# Patient Record
Sex: Male | Born: 1970 | Race: Black or African American | Hispanic: No | Marital: Single | Smoking: Former smoker
Health system: Southern US, Community
[De-identification: ages and names within clinical notes are randomized; demographics above are authoritative.]

## PROBLEM LIST (undated history)

## (undated) DIAGNOSIS — A539 Syphilis, unspecified: Secondary | ICD-10-CM

---

## 2001-04-24 ENCOUNTER — Emergency Department (HOSPITAL_COMMUNITY): Admission: EM | Admit: 2001-04-24 | Discharge: 2001-04-24 | Payer: Self-pay | Admitting: Emergency Medicine

## 2003-01-04 ENCOUNTER — Emergency Department (HOSPITAL_COMMUNITY): Admission: EM | Admit: 2003-01-04 | Discharge: 2003-01-04 | Payer: Self-pay | Admitting: Emergency Medicine

## 2003-09-09 ENCOUNTER — Emergency Department (HOSPITAL_COMMUNITY): Admission: EM | Admit: 2003-09-09 | Discharge: 2003-09-09 | Payer: Self-pay | Admitting: Family Medicine

## 2004-01-20 ENCOUNTER — Emergency Department (HOSPITAL_COMMUNITY): Admission: EM | Admit: 2004-01-20 | Discharge: 2004-01-20 | Payer: Self-pay | Admitting: Emergency Medicine

## 2004-03-12 ENCOUNTER — Emergency Department (HOSPITAL_COMMUNITY): Admission: EM | Admit: 2004-03-12 | Discharge: 2004-03-12 | Payer: Self-pay | Admitting: Family Medicine

## 2004-03-12 ENCOUNTER — Ambulatory Visit (HOSPITAL_COMMUNITY): Admission: RE | Admit: 2004-03-12 | Discharge: 2004-03-12 | Payer: Self-pay | Admitting: Family Medicine

## 2005-09-06 ENCOUNTER — Emergency Department (HOSPITAL_COMMUNITY): Admission: EM | Admit: 2005-09-06 | Discharge: 2005-09-06 | Payer: Self-pay | Admitting: Family Medicine

## 2005-11-12 ENCOUNTER — Emergency Department (HOSPITAL_COMMUNITY): Admission: EM | Admit: 2005-11-12 | Discharge: 2005-11-12 | Payer: Self-pay | Admitting: Emergency Medicine

## 2006-04-10 ENCOUNTER — Emergency Department (HOSPITAL_COMMUNITY): Admission: EM | Admit: 2006-04-10 | Discharge: 2006-04-10 | Payer: Self-pay | Admitting: Emergency Medicine

## 2009-01-01 ENCOUNTER — Emergency Department (HOSPITAL_COMMUNITY): Admission: EM | Admit: 2009-01-01 | Discharge: 2009-01-01 | Payer: Self-pay | Admitting: Family Medicine

## 2010-02-15 ENCOUNTER — Encounter: Payer: Self-pay | Admitting: Family Medicine

## 2010-04-28 LAB — GC/CHLAMYDIA PROBE AMP, GENITAL
Chlamydia, DNA Probe: NEGATIVE
GC Probe Amp, Genital: POSITIVE — AB

## 2018-07-31 ENCOUNTER — Ambulatory Visit (HOSPITAL_COMMUNITY)
Admission: EM | Admit: 2018-07-31 | Discharge: 2018-07-31 | Disposition: A | Discharge status: Home / Self Care | Payer: Self-pay | Attending: Family Medicine | Admitting: Family Medicine

## 2018-07-31 ENCOUNTER — Encounter (HOSPITAL_COMMUNITY): Payer: Self-pay

## 2018-07-31 DIAGNOSIS — B070 Plantar wart: Secondary | ICD-10-CM

## 2018-07-31 NOTE — ED Provider Notes (Signed)
[facility]    CSN: [id number] Arrival date & time: 07/31/18  1605      History   Chief Complaint Chief Complaint  Patient presents with  . Foot Pain    Right foot    HPI Berton Lanathaniel B Rodriques is a 48 y.o. male no significant past medical history presenting today for evaluation of toe pain.  Patient states that over the past 3 months he has had pain to his right second toe.  Notes that his great toe and second toe have been overlapped for most of his life and he is developed some callusing areas to both of these toes.  He has tried applying Neosporin which is helped the area on his great toe, but continues to have pain to the bottom of his right second toe.  Denies any drainage.  Denies fevers.  Pain only with weightbearing.  Pain is gradually worsened over the past 3 months.  HPI  History reviewed. No pertinent past medical history.  There are no active problems to display for this patient.   History reviewed. No pertinent surgical history.     Home Medications    Prior to Admission medications   Not on File    Family History History reviewed. No pertinent family history.  Social History Social History   Tobacco Use  . Smoking status: Current Every Day Smoker  . Smokeless tobacco: Current User  Substance Use Topics  . Alcohol use: Not on file  . Drug use: Not on file     Allergies   Patient has no known allergies.   Review of Systems Review of Systems  Constitutional: Negative for fatigue and fever.  Eyes: Negative for redness, itching and visual disturbance.  Respiratory: Negative for shortness of breath.   Cardiovascular: Negative for chest pain and leg swelling.  Gastrointestinal: Negative for nausea and vomiting.  Musculoskeletal: Negative for arthralgias and myalgias.  Skin: Positive for color change. Negative for rash and wound.  Neurological: Negative for dizziness, syncope, weakness, light-headedness and headaches.     Physical  Exam Triage Vital Signs ED Triage Vitals  Enc Vitals Group     BP 07/31/18 1635 117/80     Pulse Rate 07/31/18 1635 81     Resp 07/31/18 1635 18     Temp 07/31/18 1635 98 F (36.7 C)     Temp Source 07/31/18 1635 Oral     SpO2 07/31/18 1635 99 %     Weight --      Height --      Head Circumference --      Peak Flow --      Pain Score 07/31/18 1636 10     Pain Loc --      Pain Edu? --      Excl. in GC? --    No data found.  Updated Vital Signs BP 117/80 (BP Location: Right Arm)   Pulse 81   Temp 98 F (36.7 C) (Oral)   Resp 18   SpO2 99%   Visual Acuity Right Eye Distance:   Left Eye Distance:   Bilateral Distance:    Right Eye Near:   Left Eye Near:    Bilateral Near:     Physical Exam Vitals signs and nursing note reviewed.  Constitutional:      Appearance: He is well-developed.     Comments: No acute distress  HENT:     Head: Normocephalic and atraumatic.     Nose: Nose normal.  Eyes:  Conjunctiva/sclera: Conjunctivae normal.  Neck:     Musculoskeletal: Neck supple.  Cardiovascular:     Rate and Rhythm: Normal rate.  Pulmonary:     Effort: Pulmonary effort is normal. No respiratory distress.  Abdominal:     General: There is no distension.  Musculoskeletal: Normal range of motion.  Skin:    General: Skin is warm and dry.     Comments: Right great toe with hyperchromatic, slightly peeling callused area, no overlying erythema warmth or tenderness  Plantar surface of right second toe with peeling area without surrounding erythema or tenderness, just adjacent to this area is a hardened, slightly raised area that appears to have a black core and surrounding hypopigmentation, tender to touch in this area-see picture below  Neurological:     Mental Status: He is alert and oriented to person, place, and time.        UC Treatments / Results  Labs (all labs ordered are listed, but only abnormal results are displayed) Labs Reviewed - No data to  display  EKG   Radiology No results found.  Procedures Procedures (including critical care time)  Medications Ordered in UC Medications - No data to display  Initial Impression / Assessment and Plan / UC Course  I have reviewed the triage vital signs and the nursing notes.  Pertinent labs & imaging results that were available during my care of the patient were reviewed by me and considered in my medical decision making (see chart for details).     Area of tenderness on foot suggestive of plantar wart.  Appears to have more callused areas from chronic rubbing of toes overlapping, but does not appear infectious at this time.  Recommended over-the-counter wart removal as a trial first.  Recommended following up with podiatry or potentially primary care for freezing of wart if symptoms persisting.  Discussed strict return precautions. Patient verbalized understanding and is agreeable with plan.  Final Clinical Impressions(s) / UC Diagnoses   Final diagnoses:  Plantar wart     Discharge Instructions     Please try over the counter wart remover to area Read attached about warts Follow up if developing increased redness, swelling or pain   ED Prescriptions    None     Controlled Substance Prescriptions [place] Controlled Substance Registry consulted? Not Applicable   Janith Lima, [place] 07/31/18 1656

## 2018-07-31 NOTE — Discharge Instructions (Signed)
Please try over the counter wart remover to area Read attached about warts Follow up if developing increased redness, swelling or pain

## 2018-07-31 NOTE — ED Triage Notes (Signed)
Pt C/O foot sore that is located on his toes. Pt has tried OTC medication for some relief but nothing has help.

## 2020-01-16 ENCOUNTER — Encounter (HOSPITAL_COMMUNITY): Payer: Self-pay | Admitting: Emergency Medicine

## 2020-01-16 ENCOUNTER — Other Ambulatory Visit: Payer: Self-pay

## 2020-01-16 ENCOUNTER — Ambulatory Visit (HOSPITAL_COMMUNITY)
Admission: EM | Admit: 2020-01-16 | Discharge: 2020-01-16 | Disposition: A | Discharge status: Home / Self Care | Payer: Self-pay | Attending: Family Medicine | Admitting: Family Medicine

## 2020-01-16 DIAGNOSIS — R369 Urethral discharge, unspecified: Secondary | ICD-10-CM | POA: Insufficient documentation

## 2020-01-16 MED ORDER — LIDOCAINE HCL (PF) 1 % IJ SOLN
INTRAMUSCULAR | Status: AC
  Filled 2020-01-16: qty 2

## 2020-01-16 MED ORDER — CEFTRIAXONE SODIUM 500 MG IJ SOLR
500.0000 mg | Freq: Once | INTRAMUSCULAR | Status: AC
  Administered 2020-01-16: 500 mg via INTRAMUSCULAR

## 2020-01-16 MED ORDER — DOXYCYCLINE HYCLATE 100 MG PO CAPS: 100.0000 mg | ORAL_CAPSULE | Freq: Two times a day (BID) | ORAL | 0 refills | Status: DC

## 2020-01-16 MED ORDER — CEFTRIAXONE SODIUM 500 MG IJ SOLR
INTRAMUSCULAR | Status: AC
  Filled 2020-01-16: qty 500

## 2020-01-16 NOTE — ED Provider Notes (Signed)
  [facility]   [id number] 01/16/20 Arrival Time: 1857  ASSESSMENT & PLAN:  1. Penile discharge      Discharge Instructions     You have been given the following today for treatment of suspected gonorrhea and/or chlamydia:  cefTRIAXone (ROCEPHIN) injection 500 mg  Please pick up your prescription for doxycycline 100 mg and begin taking twice daily for the next seven (7) days.  Even though we have treated you today, we have sent testing for sexually transmitted infections. We will notify you of any positive results once they are received. If required, we will prescribe any medications you might need.  Please refrain from all sexual activity for at least the next seven days.     Pending: Labs Reviewed  CYTOLOGY, (ORAL, ANAL, URETHRAL) ANCILLARY ONLY    Will notify of any positive results. Instructed to refrain from sexual activity for at least seven days.  Reviewed expectations re: course of current medical issues. Questions answered. Outlined signs and symptoms indicating need for more acute intervention. Patient verbalized understanding. After Visit Summary given.   SUBJECTIVE:  Robert Mccoy is a 49 y.o. male who presents with complaint of penile discharge. Onset abrupt. First noticed 2 d ago. Describes discharge as thick and opaque. No specific aggravating or alleviating factors reported. Denies: urinary frequency and gross hematuria. Mild "stinging feeling" with urination. Afebrile. No abdominal or pelvic pain. No n/v. No rashes or lesions. Reports that he is sexually active.   OBJECTIVE:  Vitals:   01/16/20 1942  BP: 134/81  Pulse: 92  Resp: 20  Temp: 97.7 F (36.5 C)  TempSrc: Oral  SpO2: 100%     General appearance: alert, cooperative, appears stated age and no distress Lungs: unlabored respirations; speaks full sentences without difficulty GU: normal appearing genitalia Skin: warm and dry Psychological: alert and cooperative;  normal mood and affect.    Labs Reviewed  CYTOLOGY, (ORAL, ANAL, URETHRAL) ANCILLARY ONLY    No Known Allergies  History reviewed. No pertinent past medical history. No family history on file. Social History   Socioeconomic History  . Marital status: Single    Spouse name: Not on file  . Number of children: Not on file  . Years of education: Not on file  . Highest education level: Not on file  Occupational History  . Not on file  Tobacco Use  . Smoking status: Former Games developer  . Smokeless tobacco: Former Engineer, water and Sexual Activity  . Alcohol use: Yes    Comment: once a week/beers 16  . Drug use: Not Currently  . Sexual activity: Yes  Other Topics Concern  . Not on file  Social History Narrative  . Not on file   Social Determinants of Health   Financial Resource Strain: Not on file  Food Insecurity: Not on file  Transportation Needs: Not on file  Physical Activity: Not on file  Stress: Not on file  Social Connections: Not on file  Intimate Partner Violence: Not on file          Mardella Layman, MD 01/17/20 [phone]

## 2020-01-16 NOTE — Discharge Instructions (Signed)

## 2020-01-16 NOTE — ED Triage Notes (Signed)
Pt c/o of painful urination with penile discharge x 2 days.

## 2020-01-17 LAB — CYTOLOGY, (ORAL, ANAL, URETHRAL) ANCILLARY ONLY
Comment: NEGATIVE
Trichomonas: NEGATIVE

## 2021-03-04 ENCOUNTER — Ambulatory Visit
Admission: RE | Admit: 2021-03-04 | Discharge: 2021-03-04 | Disposition: A | Discharge status: Home / Self Care | Payer: Self-pay | Source: Ambulatory Visit | Attending: Family Medicine | Admitting: Family Medicine

## 2021-03-04 ENCOUNTER — Other Ambulatory Visit: Payer: Self-pay

## 2021-03-04 ENCOUNTER — Other Ambulatory Visit: Payer: Self-pay | Admitting: Family Medicine

## 2021-03-04 DIAGNOSIS — R52 Pain, unspecified: Secondary | ICD-10-CM

## 2021-03-04 DIAGNOSIS — T1490XA Injury, unspecified, initial encounter: Secondary | ICD-10-CM

## 2021-03-04 DIAGNOSIS — R609 Edema, unspecified: Secondary | ICD-10-CM

## 2021-03-12 ENCOUNTER — Emergency Department (HOSPITAL_COMMUNITY)
Admission: EM | Admit: 2021-03-12 | Discharge: 2021-03-12 | Disposition: A | Discharge status: Home / Self Care | Payer: No Typology Code available for payment source | Attending: Emergency Medicine | Admitting: Emergency Medicine

## 2021-03-12 ENCOUNTER — Other Ambulatory Visit: Payer: Self-pay

## 2021-03-12 ENCOUNTER — Emergency Department (HOSPITAL_COMMUNITY): Payer: No Typology Code available for payment source

## 2021-03-12 DIAGNOSIS — L539 Erythematous condition, unspecified: Secondary | ICD-10-CM | POA: Insufficient documentation

## 2021-03-12 DIAGNOSIS — W268XXA Contact with other sharp object(s), not elsewhere classified, initial encounter: Secondary | ICD-10-CM | POA: Insufficient documentation

## 2021-03-12 DIAGNOSIS — L608 Other nail disorders: Secondary | ICD-10-CM | POA: Insufficient documentation

## 2021-03-12 DIAGNOSIS — S99922A Unspecified injury of left foot, initial encounter: Secondary | ICD-10-CM | POA: Insufficient documentation

## 2021-03-12 LAB — BASIC METABOLIC PANEL
Anion gap: 8 (ref 5–15)
BUN: 11 mg/dL (ref 6–20)
CO2: 25 mmol/L (ref 22–32)
Calcium: 9.5 mg/dL (ref 8.9–10.3)
Chloride: 103 mmol/L (ref 98–111)
Creatinine, Ser: 0.95 mg/dL (ref 0.61–1.24)
GFR, Estimated: >60 mL/min (ref >60)
Glucose, Bld: 99 mg/dL (ref 70–99)
Potassium: 4.8 mmol/L (ref 3.5–5.1)
Sodium: 136 mmol/L (ref 135–145)

## 2021-03-12 LAB — CBC WITH DIFFERENTIAL/PLATELET
Abs Immature Granulocytes: 0.21 10*3/uL — ABNORMAL HIGH (ref 0.00–0.07)
Basophils Absolute: 0.1 10*3/uL (ref 0.0–0.1)
Basophils Relative: 1 %
Eosinophils Absolute: 0.2 10*3/uL (ref 0.0–0.5)
Eosinophils Relative: 3 %
HCT: 37.3 % — ABNORMAL LOW (ref 39.0–52.0)
Hemoglobin: 12 g/dL — ABNORMAL LOW (ref 13.0–17.0)
Immature Granulocytes: 4 %
Lymphocytes Relative: 28 %
Lymphs Abs: 1.4 10*3/uL (ref 0.7–4.0)
MCH: 30.6 pg (ref 26.0–34.0)
MCHC: 32.2 g/dL (ref 30.0–36.0)
MCV: 95.2 fL (ref 80.0–100.0)
Monocytes Absolute: 0.5 10*3/uL (ref 0.1–1.0)
Monocytes Relative: 9 %
Neutro Abs: 2.8 10*3/uL (ref 1.7–7.7)
Neutrophils Relative %: 55 %
Platelets: 332 10*3/uL (ref 150–400)
RBC: 3.92 MIL/uL — ABNORMAL LOW (ref 4.22–5.81)
RDW: 13.2 % (ref 11.5–15.5)
WBC: 5.1 10*3/uL (ref 4.0–10.5)
nRBC: 0 % (ref 0.0–0.2)

## 2021-03-12 MED ORDER — CEPHALEXIN 500 MG PO CAPS: 500.0000 mg | ORAL_CAPSULE | Freq: Two times a day (BID) | ORAL | 0 refills | Status: AC

## 2021-03-12 NOTE — ED Triage Notes (Signed)
Pt reports accidentally removing all of his R great toenail two weeks ago while he was attempting to  cut his toenails. Pt reports sensitivity and drainage to same, changing a dressing 4 times daily. Using neosporin without improvement.

## 2021-03-12 NOTE — Discharge Instructions (Addendum)
Your laboratory results are within normal limits today.  I have provided a prescription for antibiotics in order to prevent any further infection.

## 2021-03-12 NOTE — ED Provider Notes (Signed)
[facility] EMERGENCY DEPARTMENT Provider Note   CSN: [id number] Arrival date & time: 03/12/21  1312     History  Chief Complaint  Patient presents with   Toe Injury    Robert Mccoy is a 51 y.o. male.  51 y.o male with no PMH presents to the ED with a chief complaint of left great toe pain x 2 weeks. Patient reports he was cutting his left toenail, when he suddenly missed cutting a piece of skin.  He has now developed a wound to the area which has been draining with some fluctuance present.  He does not have any prior history of diabetes.  He has been applying Neosporin to the area without much improvement in symptoms.  There is pain with palpation.  No fevers, no chills, no prior history of amputations.  The history is provided by the patient and medical records.      Home Medications Prior to Admission medications   Medication Sig Start Date End Date Taking? Authorizing Provider  cephALEXin (KEFLEX) 500 MG capsule Take 1 capsule (500 mg total) by mouth 2 (two) times daily for 7 days. 03/12/21 03/19/21 Yes Yarely Bebee, Leonie Douglas, PA-C  doxycycline (VIBRAMYCIN) 100 MG capsule Take 1 capsule (100 mg total) by mouth 2 (two) times daily. 01/16/20   Mardella Layman, MD      Allergies    Patient has no known allergies.    Review of Systems   Review of Systems  Constitutional:  Negative for chills and fever.  Respiratory:  Negative for shortness of breath.   Cardiovascular:  Negative for chest pain.  Gastrointestinal:  Negative for abdominal pain.  Musculoskeletal:  Negative for back pain.  Skin:  Positive for wound.  All other systems reviewed and are negative.  Physical Exam Updated Vital Signs BP 123/78    Pulse (!) 56    Temp 98 F (36.7 C)    Resp 18    SpO2 100%  Physical Exam Vitals and nursing note reviewed.  Constitutional:      Appearance: Normal appearance.  HENT:     Head: Normocephalic and atraumatic.  Cardiovascular:     Rate and Rhythm:  Normal rate.     Pulses:          Dorsalis pedis pulses are 2+ on the left side.       Posterior tibial pulses are 2+ on the left side.  Pulmonary:     Effort: Pulmonary effort is normal.  Abdominal:     General: Abdomen is flat.  Musculoskeletal:     Cervical back: Normal range of motion and neck supple.  Feet:     Left foot:     Skin integrity: Skin integrity normal. No erythema.     Comments: Partially missing nail of the left great toe, with active fluctuance noted. No surrounding erythema, no pitting edema.  Biliary refill is unremarkable, pulses present. Skin:    General: Skin is warm and dry.     Findings: Erythema present.  Neurological:     Mental Status: He is alert and oriented to person, place, and time.    ED Results / Procedures / Treatments   Labs (all labs ordered are listed, but only abnormal results are displayed) Labs Reviewed  CBC WITH DIFFERENTIAL/PLATELET - Abnormal; Notable for the following components:      Result Value   RBC 3.92 (*)    Hemoglobin 12.0 (*)    HCT 37.3 (*)    Abs Immature  Granulocytes 0.21 (*)    All other components within normal limits  BASIC METABOLIC PANEL    EKG None  Radiology DG Toe Great Left  Result Date: 03/12/2021 CLINICAL DATA:  Left toe wound for 2 weeks EXAM: LEFT GREAT TOE COMPARISON:  None. FINDINGS: There is no evidence of fracture or dislocation. There is no evidence of arthropathy or other focal bone abnormality. No evidence of bony erosion or sclerosis. Soft tissue wound of the medial distal left great toe with diffuse soft tissue edema about the digit. IMPRESSION: 1. No fracture or dislocation of the left great toe. No radiographic findings to suggest osteomyelitis. 2. Soft tissue wound of the medial distal left great toe with diffuse soft tissue edema about the digit. Electronically Signed   By: Jearld Lesch M.D.   On: 03/12/2021 14:32    Procedures Procedures    Medications Ordered in ED Medications - No  data to display  ED Course/ Medical Decision Making/ A&P                           Medical Decision Making  This patient presents to the ED for concern of toe pain, this involves a number of treatment options, and is a complaint that carries with it a low risk of complications and morbidity.  The differential diagnosis includes infection.    Co morbidities: Discussed in HPI   Brief History:  Patient with a prior history of diabetes presents to the ED status post left toe injury after attempting to cut his toenail in taking a large part of his toenail out.  His exam is unremarkable without any surrounding erythema, fluctuance present with some active white discharge noted.   EMR reviewed including pt PMHx, past surgical history and past visits to ER.   See HPI for more details   Lab Tests:  I ordered and independently interpreted labs.  The pertinent results include:    I personally reviewed all laboratory work and imaging. Metabolic panel without any acute abnormality specifically kidney function within normal limits and no significant electrolyte abnormalities. CBC without leukocytosis or significant anemia.   Imaging Studies:  NAD. I personally reviewed all imaging studies and no acute abnormality found. I agree with radiology interpretation.  No signs of osteomyelitis at this time.    Social Determinants of Health:  The patient's social determinants of health were a factor in the care of this patient    Problem List / ED Course:  And here with toe injury concern for cellulitis at this time.  No prior podiatry established.  Does have an appointment in 2 weeks.  No prior history of diabetes noted, been applying Neosporin without much improvement in symptoms.  I did discuss with him antibiotic treatment at this time but will also need to see podiatry.   Dispostion:  After consideration of the diagnostic results and the patients response to treatment, I feel that the  patent would benefit from antibiotic therapy at this time to prevent any infection.  He was placed on a Keflex regimen 500 mg twice daily for the next 7 days.  Patient is agreeable to plan and management at this time, patient stable for discharge.    Portions of this note were generated with Scientist, clinical (histocompatibility and immunogenetics). Dictation errors may occur despite best attempts at proofreading.   Final Clinical Impression(s) / ED Diagnoses Final diagnoses:  Injury of toe on left foot, initial encounter    Rx /  DC Orders ED Discharge Orders          Ordered    cephALEXin (KEFLEX) 500 MG capsule  2 times daily        03/12/21 2011              Claude Manges, Cordelia Poche 03/12/21 2011    Floyd, Dan, [place] 03/12/21 2237

## 2021-03-12 NOTE — ED Provider Triage Note (Signed)
Emergency Medicine Provider Triage Evaluation Note  Robert Mccoy , a 51 y.o. male  was evaluated in triage.  Pt complains of left great toe pain.  States that same began 2 weeks ago when he was cutting his toenail, states that he accidentally cut his toenail back to far and subsequently developed a wound to the area.  States that it is swollen and draining purulent fluid.  He has been attempted to manage with topical antibiotic ointment without improvement.  No history of diabetes.  No fevers or chills.  Review of Systems  Positive: L toe wound Negative: Fever, chills  Physical Exam  BP 124/81    Pulse 70    Temp 98.8 F (37.1 C) (Oral)    Resp 18    SpO2 99%  Gen:   Awake, no distress   Resp:  Normal effort  MSK:   Moves extremities without difficulty  Other:  Area of fluctuance noted to the tip of the left great toe with associated erythema  Medical Decision Making  Medically screening exam initiated at 2:06 PM.  Appropriate orders placed.  NOEH SPARACINO was informed that the remainder of the evaluation will be completed by another provider, this initial triage assessment does not replace that evaluation, and the importance of remaining in the ED until their evaluation is complete.     Robert Bandy, PA-C 03/12/21 1409

## 2022-07-26 ENCOUNTER — Other Ambulatory Visit: Payer: Self-pay

## 2022-07-26 ENCOUNTER — Encounter (HOSPITAL_COMMUNITY): Payer: Self-pay

## 2022-07-26 ENCOUNTER — Emergency Department (HOSPITAL_COMMUNITY): Payer: No Typology Code available for payment source

## 2022-07-26 ENCOUNTER — Ambulatory Visit (HOSPITAL_COMMUNITY)
Admission: RE | Admit: 2022-07-26 | Discharge: 2022-07-26 | Disposition: A | Discharge status: Home / Self Care | Payer: No Typology Code available for payment source | Source: Ambulatory Visit | Attending: Internal Medicine | Admitting: Internal Medicine

## 2022-07-26 ENCOUNTER — Ambulatory Visit (INDEPENDENT_AMBULATORY_CARE_PROVIDER_SITE_OTHER): Payer: No Typology Code available for payment source

## 2022-07-26 ENCOUNTER — Inpatient Hospital Stay (HOSPITAL_COMMUNITY)
Admission: EM | Admit: 2022-07-26 | Discharge: 2022-07-28 | DRG: 384 | Disposition: A | Discharge status: Home / Self Care | Payer: No Typology Code available for payment source | Attending: Family Medicine | Admitting: Family Medicine

## 2022-07-26 VITALS — BP 128/88 | HR 101 | Temp 99.5°F | Resp 16

## 2022-07-26 DIAGNOSIS — K118 Other diseases of salivary glands: Secondary | ICD-10-CM | POA: Diagnosis present

## 2022-07-26 DIAGNOSIS — K859 Acute pancreatitis without necrosis or infection, unspecified: Secondary | ICD-10-CM

## 2022-07-26 DIAGNOSIS — R1114 Bilious vomiting: Secondary | ICD-10-CM

## 2022-07-26 DIAGNOSIS — R509 Fever, unspecified: Secondary | ICD-10-CM | POA: Diagnosis present

## 2022-07-26 DIAGNOSIS — A084 Viral intestinal infection, unspecified: Secondary | ICD-10-CM | POA: Diagnosis present

## 2022-07-26 DIAGNOSIS — R591 Generalized enlarged lymph nodes: Secondary | ICD-10-CM | POA: Diagnosis present

## 2022-07-26 DIAGNOSIS — R748 Abnormal levels of other serum enzymes: Secondary | ICD-10-CM | POA: Diagnosis present

## 2022-07-26 DIAGNOSIS — R9431 Abnormal electrocardiogram [ECG] [EKG]: Secondary | ICD-10-CM | POA: Diagnosis present

## 2022-07-26 DIAGNOSIS — E860 Dehydration: Secondary | ICD-10-CM | POA: Diagnosis not present

## 2022-07-26 DIAGNOSIS — R1030 Lower abdominal pain, unspecified: Secondary | ICD-10-CM

## 2022-07-26 DIAGNOSIS — K449 Diaphragmatic hernia without obstruction or gangrene: Secondary | ICD-10-CM | POA: Diagnosis present

## 2022-07-26 DIAGNOSIS — M778 Other enthesopathies, not elsewhere classified: Secondary | ICD-10-CM

## 2022-07-26 DIAGNOSIS — R1904 Left lower quadrant abdominal swelling, mass and lump: Secondary | ICD-10-CM

## 2022-07-26 DIAGNOSIS — K2900 Acute gastritis without bleeding: Secondary | ICD-10-CM

## 2022-07-26 DIAGNOSIS — R911 Solitary pulmonary nodule: Secondary | ICD-10-CM

## 2022-07-26 DIAGNOSIS — K2980 Duodenitis without bleeding: Secondary | ICD-10-CM | POA: Diagnosis present

## 2022-07-26 DIAGNOSIS — A5149 Other secondary syphilitic conditions: Secondary | ICD-10-CM | POA: Diagnosis present

## 2022-07-26 DIAGNOSIS — E8809 Other disorders of plasma-protein metabolism, not elsewhere classified: Secondary | ICD-10-CM | POA: Diagnosis present

## 2022-07-26 DIAGNOSIS — K2289 Other specified disease of esophagus: Secondary | ICD-10-CM | POA: Diagnosis present

## 2022-07-26 DIAGNOSIS — K769 Liver disease, unspecified: Secondary | ICD-10-CM | POA: Diagnosis not present

## 2022-07-26 DIAGNOSIS — R935 Abnormal findings on diagnostic imaging of other abdominal regions, including retroperitoneum: Secondary | ICD-10-CM

## 2022-07-26 DIAGNOSIS — R61 Generalized hyperhidrosis: Secondary | ICD-10-CM | POA: Diagnosis present

## 2022-07-26 DIAGNOSIS — R52 Pain, unspecified: Secondary | ICD-10-CM

## 2022-07-26 DIAGNOSIS — R918 Other nonspecific abnormal finding of lung field: Secondary | ICD-10-CM | POA: Diagnosis present

## 2022-07-26 DIAGNOSIS — R739 Hyperglycemia, unspecified: Secondary | ICD-10-CM | POA: Diagnosis not present

## 2022-07-26 DIAGNOSIS — R112 Nausea with vomiting, unspecified: Secondary | ICD-10-CM | POA: Diagnosis not present

## 2022-07-26 DIAGNOSIS — D649 Anemia, unspecified: Secondary | ICD-10-CM | POA: Diagnosis present

## 2022-07-26 DIAGNOSIS — R1900 Intra-abdominal and pelvic swelling, mass and lump, unspecified site: Secondary | ICD-10-CM | POA: Diagnosis present

## 2022-07-26 DIAGNOSIS — R1013 Epigastric pain: Secondary | ICD-10-CM

## 2022-07-26 DIAGNOSIS — D1803 Hemangioma of intra-abdominal structures: Secondary | ICD-10-CM | POA: Diagnosis present

## 2022-07-26 DIAGNOSIS — K259 Gastric ulcer, unspecified as acute or chronic, without hemorrhage or perforation: Principal | ICD-10-CM | POA: Diagnosis present

## 2022-07-26 DIAGNOSIS — R109 Unspecified abdominal pain: Secondary | ICD-10-CM | POA: Diagnosis present

## 2022-07-26 DIAGNOSIS — N5089 Other specified disorders of the male genital organs: Secondary | ICD-10-CM | POA: Diagnosis present

## 2022-07-26 DIAGNOSIS — J439 Emphysema, unspecified: Secondary | ICD-10-CM | POA: Diagnosis present

## 2022-07-26 DIAGNOSIS — Z1152 Encounter for screening for COVID-19: Secondary | ICD-10-CM

## 2022-07-26 DIAGNOSIS — N281 Cyst of kidney, acquired: Secondary | ICD-10-CM | POA: Diagnosis present

## 2022-07-26 DIAGNOSIS — R1010 Upper abdominal pain, unspecified: Secondary | ICD-10-CM | POA: Diagnosis not present

## 2022-07-26 DIAGNOSIS — F17210 Nicotine dependence, cigarettes, uncomplicated: Secondary | ICD-10-CM | POA: Diagnosis present

## 2022-07-26 LAB — LIPID PANEL
Cholesterol: 128 mg/dL (ref 0–200)
HDL: 19 mg/dL — ABNORMAL LOW (ref >40)
LDL Cholesterol: 82 mg/dL (ref 0–99)
Total CHOL/HDL Ratio: 6.7 RATIO
Triglycerides: 137 mg/dL (ref <150)
VLDL: 27 mg/dL (ref 0–40)

## 2022-07-26 LAB — BASIC METABOLIC PANEL
Anion gap: 18 — ABNORMAL HIGH (ref 5–15)
BUN: 7 mg/dL (ref 6–20)
CO2: 17 mmol/L — ABNORMAL LOW (ref 22–32)
Calcium: 9.3 mg/dL (ref 8.9–10.3)
Chloride: 100 mmol/L (ref 98–111)
Creatinine, Ser: 0.8 mg/dL (ref 0.61–1.24)
GFR, Estimated: >60 mL/min (ref >60)
Glucose, Bld: 223 mg/dL — ABNORMAL HIGH (ref 70–99)
Potassium: 4.5 mmol/L (ref 3.5–5.1)
Sodium: 135 mmol/L (ref 135–145)

## 2022-07-26 LAB — HEPATIC FUNCTION PANEL
ALT: 60 U/L — ABNORMAL HIGH (ref 0–44)
AST: 46 U/L — ABNORMAL HIGH (ref 15–41)
Albumin: 2.9 g/dL — ABNORMAL LOW (ref 3.5–5.0)
Alkaline Phosphatase: 140 U/L — ABNORMAL HIGH (ref 38–126)
Bilirubin, Direct: 0.3 mg/dL — ABNORMAL HIGH (ref 0.0–0.2)
Indirect Bilirubin: 0.6 mg/dL (ref 0.3–0.9)
Total Bilirubin: 0.9 mg/dL (ref 0.3–1.2)
Total Protein: 7.4 g/dL (ref 6.5–8.1)

## 2022-07-26 LAB — CBC
HCT: 38 % — ABNORMAL LOW (ref 39.0–52.0)
Hemoglobin: 11.6 g/dL — ABNORMAL LOW (ref 13.0–17.0)
MCH: 30 pg (ref 26.0–34.0)
MCHC: 30.5 g/dL (ref 30.0–36.0)
MCV: 98.2 fL (ref 80.0–100.0)
Platelets: 334 10*3/uL (ref 150–400)
RBC: 3.87 MIL/uL — ABNORMAL LOW (ref 4.22–5.81)
RDW: 13.2 % (ref 11.5–15.5)
WBC: 7.3 10*3/uL (ref 4.0–10.5)
nRBC: 0 % (ref 0.0–0.2)

## 2022-07-26 LAB — TROPONIN I (HIGH SENSITIVITY)
Troponin I (High Sensitivity): 20 ng/L — ABNORMAL HIGH (ref <18)
Troponin I (High Sensitivity): 21 ng/L — ABNORMAL HIGH (ref <18)
Troponin I (High Sensitivity): 26 ng/L — ABNORMAL HIGH (ref <18)

## 2022-07-26 LAB — HIV ANTIBODY (ROUTINE TESTING W REFLEX): HIV Screen 4th Generation wRfx: NONREACTIVE

## 2022-07-26 LAB — HEMOGLOBIN A1C
Hgb A1c MFr Bld: 5.6 % (ref 4.8–5.6)
Mean Plasma Glucose: 114.02 mg/dL

## 2022-07-26 LAB — TSH: TSH: 2.739 u[IU]/mL (ref 0.350–4.500)

## 2022-07-26 LAB — PSA: Prostatic Specific Antigen: 0.84 ng/mL (ref 0.00–4.00)

## 2022-07-26 LAB — LIPASE, BLOOD: Lipase: 155 U/L — ABNORMAL HIGH (ref 11–51)

## 2022-07-26 MED ORDER — PREDNISONE 10 MG PO TABS: 10.0000 mg | ORAL_TABLET | Freq: Once | ORAL | Status: DC

## 2022-07-26 MED ORDER — KETOROLAC TROMETHAMINE 15 MG/ML IJ SOLN
15.0000 mg | Freq: Once | INTRAMUSCULAR | Status: AC
  Administered 2022-07-26: 15 mg via INTRAVENOUS
  Filled 2022-07-26: qty 1

## 2022-07-26 MED ORDER — PANTOPRAZOLE SODIUM 40 MG IV SOLR
40.0000 mg | Freq: Once | INTRAVENOUS | Status: AC
  Administered 2022-07-26: 40 mg via INTRAVENOUS
  Filled 2022-07-26 (×2): qty 10

## 2022-07-26 MED ORDER — METOCLOPRAMIDE HCL 5 MG/ML IJ SOLN
10.0000 mg | Freq: Once | INTRAMUSCULAR | Status: AC
  Administered 2022-07-26: 10 mg via INTRAVENOUS
  Filled 2022-07-26: qty 2

## 2022-07-26 MED ORDER — ACETAMINOPHEN 325 MG PO TABS
650.0000 mg | ORAL_TABLET | Freq: Four times a day (QID) | ORAL | Status: DC | PRN
  Administered 2022-07-27 – 2022-07-28 (×5): 650 mg via ORAL
  Filled 2022-07-26 (×5): qty 2

## 2022-07-26 MED ORDER — FAMOTIDINE 20 MG PO TABS: 20.0000 mg | ORAL_TABLET | Freq: Every day | ORAL | Status: DC | PRN

## 2022-07-26 MED ORDER — ONDANSETRON 4 MG PO TBDP
ORAL_TABLET | ORAL | Status: AC
  Filled 2022-07-26: qty 1

## 2022-07-26 MED ORDER — ACETAMINOPHEN 500 MG PO TABS
1000.0000 mg | ORAL_TABLET | Freq: Once | ORAL | Status: AC
  Administered 2022-07-26: 1000 mg via ORAL
  Filled 2022-07-26: qty 2

## 2022-07-26 MED ORDER — MORPHINE SULFATE (PF) 4 MG/ML IV SOLN
4.0000 mg | Freq: Once | INTRAVENOUS | Status: AC
  Administered 2022-07-26: 4 mg via INTRAVENOUS
  Filled 2022-07-26: qty 1

## 2022-07-26 MED ORDER — PROCHLORPERAZINE EDISYLATE 10 MG/2ML IJ SOLN
10.0000 mg | Freq: Four times a day (QID) | INTRAMUSCULAR | Status: DC | PRN
  Administered 2022-07-28: 10 mg via INTRAVENOUS
  Filled 2022-07-26: qty 2

## 2022-07-26 MED ORDER — LACTATED RINGERS IV SOLN: INTRAVENOUS | Status: DC

## 2022-07-26 MED ORDER — ONDANSETRON HCL 4 MG/2ML IJ SOLN: 4.0000 mg | Freq: Once | INTRAMUSCULAR | Status: DC

## 2022-07-26 MED ORDER — ONDANSETRON 4 MG PO TBDP
4.0000 mg | ORAL_TABLET | Freq: Once | ORAL | Status: AC
  Administered 2022-07-26: 4 mg via ORAL

## 2022-07-26 MED ORDER — IOHEXOL 350 MG/ML SOLN
75.0000 mL | Freq: Once | INTRAVENOUS | Status: AC | PRN
  Administered 2022-07-26: 75 mL via INTRAVENOUS

## 2022-07-26 MED ORDER — DIPHENHYDRAMINE HCL 50 MG/ML IJ SOLN
25.0000 mg | Freq: Once | INTRAMUSCULAR | Status: AC
  Administered 2022-07-26: 25 mg via INTRAVENOUS
  Filled 2022-07-26: qty 1

## 2022-07-26 MED ORDER — ONDANSETRON HCL 4 MG/2ML IJ SOLN
4.0000 mg | Freq: Once | INTRAMUSCULAR | Status: AC
  Administered 2022-07-26: 4 mg via INTRAVENOUS
  Filled 2022-07-26: qty 2

## 2022-07-26 MED ORDER — ENOXAPARIN SODIUM 40 MG/0.4ML IJ SOSY
40.0000 mg | PREFILLED_SYRINGE | INTRAMUSCULAR | Status: DC
  Administered 2022-07-26: 40 mg via SUBCUTANEOUS
  Filled 2022-07-26: qty 0.4

## 2022-07-26 MED ORDER — KETOROLAC TROMETHAMINE 15 MG/ML IJ SOLN
15.0000 mg | Freq: Four times a day (QID) | INTRAMUSCULAR | Status: DC | PRN
  Administered 2022-07-26 – 2022-07-28 (×7): 15 mg via INTRAVENOUS
  Filled 2022-07-26 (×7): qty 1

## 2022-07-26 MED ORDER — LACTATED RINGERS IV BOLUS
1000.0000 mL | Freq: Once | INTRAVENOUS | Status: AC
  Administered 2022-07-26: 1000 mL via INTRAVENOUS

## 2022-07-26 MED ORDER — ENOXAPARIN SODIUM 40 MG/0.4ML IJ SOSY
40.0000 mg | PREFILLED_SYRINGE | INTRAMUSCULAR | Status: DC
  Administered 2022-07-27: 40 mg via SUBCUTANEOUS
  Filled 2022-07-26: qty 0.4

## 2022-07-26 NOTE — Assessment & Plan Note (Addendum)
Unclear etiology, though the imaging obtained in the ED is certainly concerning for metastatic infection.  Unclear what the primary source of this infection could be, though lung would seem likely given his smoking history.  There is pretty impressive mucosal thickening of the stomach on imaging, so gastric cancer cannot be ruled out.  It is also possible that this is a primary liver lesion given his hyperenhancing 7 capsular lesion in the posterior dome and ill-defined hypodense lesion in the anterior left lobe.  Lipase minimally elevated to 155 but patient has been eating and has had a fairly benign abdominal exam so doubt true pancreatitis. The enlarged portacaval lymph nodes are suspicious for metastatic disease.  Suspect ultimately he will need biopsy. -Admit to MedSurg -Will discuss with IR in the morning to find the site most amenable to biopsy. If IR is unable to find a suitable percutaneous window, could consider Pulm consult for bronchoscopy -Would also favor getting GI involved to evaluate his gastric mucosal changes.  Also note that he has never had a colonoscopy.  Consider the possibility of primary colon cancer. -MRI of the liver to better characterize the lesions described on the CT -Will obtain a PSA -TSH due to night sweats -Patient has been away from care, does not follow with primary physician.  Therefore we will get some basic labs including A1c, lipid panel -Tylenol for fever -Compazine for nausea  -Toradol for pain

## 2022-07-26 NOTE — ED Provider Notes (Signed)
[facility] AT [facility] Provider Note   CSN: [id number] Arrival date & time: 07/26/22  1036     History  Chief Complaint  Patient presents with   Abdominal Pain   Chest Pain   Headache    Robert Mccoy is a 52 y.o. male with no past medical history presents to the ED complaining of lower chest and upper abdominal pain for the past 2 weeks.  He states that initially it felt like body aches and he was concerned he was developing a cold so he started taking over-the-counter medications.  He has some relief of the pain but it did not fully go away.  Yesterday began to have nausea and vomiting every time he tried to eat or drink.  States that at the beginning of symptoms he was having some diarrhea but this is mostly resolved.  Last bowel movement was this morning but he states it was small.  States he has been unable to eat or drink secondary to symptoms.  Possible sick contact with similar symptoms at time of onset.  No hematemesis, hematochezia, or melena.  He does intermittently have episodes where he becomes sweaty but has not measured a documented fever at home.  No personal or family history of CAD.  No known spoiled foods. No leg pain, leg swelling, or dyspnea.       Home Medications No daily medication  Allergies    Patient has no known allergies.    Review of Systems   Review of Systems  All other systems reviewed and are negative.   Physical Exam Updated Vital Signs BP 131/80   Pulse 89   Temp 100.3 F (37.9 C)   Resp 18   SpO2 97%  Physical Exam Vitals and nursing note reviewed.  Constitutional:      General: He is not in acute distress.    Appearance: Normal appearance. He is diaphoretic. He is not ill-appearing or toxic-appearing.  HENT:     Head: Normocephalic and atraumatic.     Mouth/Throat:     Comments: Mildly dry mucous membranes Eyes:     Conjunctiva/sclera: Conjunctivae normal.  Cardiovascular:     Rate and  Rhythm: Normal rate and regular rhythm.     Heart sounds: No murmur heard.    No S3 or S4 sounds.  Pulmonary:     Effort: Pulmonary effort is normal. No respiratory distress.     Breath sounds: Normal breath sounds. No stridor. No wheezing, rhonchi or rales.  Chest:     Chest wall: No mass, deformity, tenderness, crepitus or edema.  Abdominal:     General: Abdomen is flat.     Palpations: Abdomen is soft. There is no pulsatile mass.     Tenderness: There is abdominal tenderness in the epigastric area. There is no right CVA tenderness, left CVA tenderness, guarding or rebound. Negative signs include Murphy's sign and McBurney's sign.  Musculoskeletal:        General: Normal range of motion.     Cervical back: Neck supple.     Right lower leg: No tenderness. No edema.     Left lower leg: No tenderness. No edema.  Skin:    General: Skin is warm.     Capillary Refill: Capillary refill takes less than 2 seconds.     Coloration: Skin is not jaundiced or pale.     Findings: No rash.  Neurological:     Mental Status: He is alert. Mental status  is at baseline.  Psychiatric:        Mood and Affect: Mood normal.        Behavior: Behavior normal.     ED Results / Procedures / Treatments   Labs (all labs ordered are listed, but only abnormal results are displayed) Labs Reviewed  BASIC METABOLIC PANEL - Abnormal; Notable for the following components:      Result Value   CO2 17 (*)    Glucose, Bld 223 (*)    Anion gap 18 (*)    All other components within normal limits  CBC - Abnormal; Notable for the following components:   RBC 3.87 (*)    Hemoglobin 11.6 (*)    HCT 38.0 (*)    All other components within normal limits  LIPASE, BLOOD - Abnormal; Notable for the following components:   Lipase 155 (*)    All other components within normal limits  TROPONIN I (HIGH SENSITIVITY) - Abnormal; Notable for the following components:   Troponin I (High Sensitivity) 20 (*)    All other  components within normal limits  TROPONIN I (HIGH SENSITIVITY) - Abnormal; Notable for the following components:   Troponin I (High Sensitivity) 21 (*)    All other components within normal limits  SARS CORONAVIRUS 2 BY RT PCR  URINALYSIS, ROUTINE W REFLEX MICROSCOPIC  HEPATIC FUNCTION PANEL    EKG EKG Interpretation Date/Time:  Monday July 26 2022 12:46:07 EDT Ventricular Rate:  88 PR Interval:  142 QRS Duration:  104 QT Interval:  369 QTC Calculation: 447 R Axis:   -34  Text Interpretation: Sinus rhythm Biatrial enlargement Left axis deviation RSR' in V1 or V2, right VCD or RVH ST elevation suggests acute pericarditis Confirmed by Benjiman Core [phone]) on 07/26/2022 4:14:40 PM  Radiology CT CHEST ABDOMEN PELVIS W CONTRAST  Result Date: 07/26/2022 CLINICAL DATA:  Lower chest and upper abdominal pain 2 weeks, now with nausea, vomiting and sweats, recent diarrhea * Tracking Code: BO * EXAM: CT CHEST, ABDOMEN, AND PELVIS WITH CONTRAST TECHNIQUE: Multidetector CT imaging of the chest, abdomen and pelvis was performed following the standard protocol during bolus administration of intravenous contrast. RADIATION DOSE REDUCTION: This exam was performed according to the departmental dose-optimization program which includes automated exposure control, adjustment of the mA and/or kV according to patient size and/or use of iterative reconstruction technique. CONTRAST:  75mL OMNIPAQUE IOHEXOL 350 MG/ML SOLN COMPARISON:  None Available. FINDINGS: CT CHEST FINDINGS Cardiovascular: No significant vascular findings. Cardiomegaly. No pericardial effusion. Mediastinum/Nodes: No enlarged mediastinal, hilar, or axillary lymph nodes. Thyroid gland, trachea, and esophagus demonstrate no significant findings. Lungs/Pleura: Diffuse bilateral bronchial wall thickening. Dependent bibasilar scarring or atelectasis. Mixed solid and cystic nodule of the anterior right upper lobe measuring 0.9 x 0.9 cm, solid component  of the left aspect measuring 0.6 cm (series 5, image 71). Additional small solid nodule just posteriorly measuring 0.6 cm (series 5, image 70). Minimal centrilobular and paraseptal emphysema. No pleural effusion or pneumothorax. Musculoskeletal: No chest wall abnormality. No acute osseous findings. CT ABDOMEN PELVIS FINDINGS Hepatobiliary: Hyperenhancing subcapsular lesion of the posterior liver dome measuring 2.6 x 1.9 cm (series 3, image 52). Additional ill-defined hypodense lesion of the anterior left lobe of the liver, hepatic segment IVA, measuring 1.7 x 1.6 cm (series 3, image 47). Contracted gallbladder. No gallstones, gallbladder wall thickening, or biliary dilatation. Pancreas: Unremarkable. No pancreatic ductal dilatation or surrounding inflammatory changes. Spleen: Normal in size without significant abnormality. Adrenals/Urinary Tract: Adrenal glands are unremarkable.  Simple, benign left renal cortical cysts, for which no further follow-up or characterization is required. Kidneys are otherwise normal, without renal calculi, solid lesion, or hydronephrosis. Bladder is unremarkable. Stomach/Bowel: Mucosal thickening of the gastric antrum and pylorus (series 3, image 63). Appendix appears normal. No evidence of bowel wall thickening, distention, or inflammatory changes. Descending and sigmoid diverticulosis. Vascular/Lymphatic: Scattered aortic atherosclerosis. Enlarged portacaval lymph nodes measuring up to 2.9 x 1.7 cm (series 3, image 63). Enlarged bilateral inguinal lymph nodes measuring up to 2.7 x 1.4 cm (series 3, image 120). Reproductive: No mass or other abnormality. Other: No abdominal wall hernia or abnormality. No ascites. Musculoskeletal: No acute osseous findings. IMPRESSION: 1. Mucosal thickening of the gastric antrum and pylorus, suggestive of nonspecific infectious or inflammatory gastritis. 2. Diverticulosis without acute diverticulitis. 3. Mixed solid and cystic pulmonary nodule of the  anterior right upper lobe measuring 0.9 x 0.9 cm, solid component of the left aspect measuring 0.6 cm. Although possibly infectious or inflammatory, this nodule is modestly concerning for primary lung malignancy. Additional small solid nodule just posteriorly measuring 0.6 cm. Follow-up non-contrast CT recommended at 3-6 months to confirm persistence. If unchanged, and solid component remains <6 mm, annual CT is recommended until 5 years of stability has been established. If persistent these nodules should be considered highly suspicious if the solid component of the nodule is 6 mm or greater in size and enlarging. This recommendation follows the consensus statement: Guidelines for Management of Incidental Pulmonary Nodules Detected on CT Images: From the Fleischner Society 2017; Radiology 2017; 284:228-243. 4. Hyperenhancing subcapsular lesion of the posterior liver dome measuring 2.6 x 1.9 cm. Additional ill-defined hypodense lesion of the anterior left lobe of the liver, hepatic segment IVA, measuring 1.7 x 1.6 cm. Although possibly benign, and particularly the posterior lesion possibly a flash filling hemangioma, these are incompletely characterized on this single phase contrast enhanced examination. Recommend multiphasic contrast enhanced MRI to further evaluate. 5. Enlarged portacaval lymph nodes measuring up to 2.9 x 1.7 cm. Enlarged bilateral inguinal lymph nodes measuring up to 2.7 x 1.4 cm. These are nonspecific and may be reactive, however are suspicious for metastatic disease. 6. Cardiomegaly. 7. Minimal emphysema and diffuse bilateral bronchial wall thickening. Aortic Atherosclerosis (ICD10-I70.0) and Emphysema (ICD10-J43.9). Electronically Signed   By: Jearld Lesch M.D.   On: 07/26/2022 15:42   DG Chest 2 View  Result Date: 07/26/2022 CLINICAL DATA:  cp EXAM: CHEST - 2 VIEW COMPARISON:  CXR 04/10/06 FINDINGS: No pleural effusion. No pneumothorax. No focal airspace opacity. Normal cardiac and  mediastinal contours. No radiographically apparent displaced rib fractures. Visualized upper abdomen is unremarkable. Vertebral body heights are maintained. IMPRESSION: No focal airspace opacity Electronically Signed   By: Lorenza Cambridge M.D.   On: 07/26/2022 12:06   DG Abd 2 Views  Result Date: 07/26/2022 CLINICAL DATA:  Abdominal pain, fever and constipation EXAM: ABDOMEN - 2 VIEW COMPARISON:  None Available. FINDINGS: No dilated large or small bowel. Normal stool volume. Gas and stool in the rectum. No pathologic calcifications. IMPRESSION: No acute abdominal findings.  Normal volume of stool. Electronically Signed   By: Genevive Bi M.D.   On: 07/26/2022 09:55    Procedures Procedures    Medications Ordered in ED Medications  lactated ringers infusion ( Intravenous New Bag/Given 07/26/22 1608)  acetaminophen (TYLENOL) tablet 1,000 mg (has no administration in time range)  ondansetron (ZOFRAN) injection 4 mg (4 mg Intravenous Given 07/26/22 1314)  lactated ringers bolus 1,000 mL (0 mLs  Intravenous Stopped 07/26/22 1633)  pantoprazole (PROTONIX) injection 40 mg (40 mg Intravenous Given 07/26/22 1326)  ketorolac (TORADOL) 15 MG/ML injection 15 mg (15 mg Intravenous Given 07/26/22 1314)  iohexol (OMNIPAQUE) 350 MG/ML injection 75 mL (75 mLs Intravenous Contrast Given 07/26/22 1453)  metoCLOPramide (REGLAN) injection 10 mg (10 mg Intravenous Given 07/26/22 1601)  diphenhydrAMINE (BENADRYL) injection 25 mg (25 mg Intravenous Given 07/26/22 1600)  morphine (PF) 4 MG/ML injection 4 mg (4 mg Intravenous Given 07/26/22 1601)    ED Course/ Medical Decision Making/ A&P                             Medical Decision Making Amount and/or Complexity of Data Reviewed Labs: ordered. Decision-making details documented in ED Course. Radiology: ordered. Decision-making details documented in ED Course. ECG/medicine tests: ordered. Decision-making details documented in ED Course.  Risk OTC drugs. Prescription drug  management. Decision regarding hospitalization.   Medical Decision Making:   DEYMAR NIVENS is a 52 y.o. male who presented to the ED today with abdominal pain / chest pain detailed above.     Complete initial physical exam performed, notably the patient  was in no acute distress.  He had epigastric tenderness but abdomen soft, no rebound, guarding, or peritoneal signs.  No CVA tenderness.  Regular rate and rhythm.  Lungs clear to auscultation.  Neurologically intact.  No lower extremity edema or calf tenderness.    Reviewed and confirmed nursing documentation for past medical history, family history, social history.    Initial Assessment:   With the patient's presentation of abdominal pain, differential diagnosis includes but is not limited to AAA, mesenteric ischemia, appendicitis, diverticulitis, DKA, gastritis, gastroenteritis, AMI, nephrolithiasis, pancreatitis, peritonitis, adrenal insufficiency, intestinal ischemia, constipation, UTI, SBO/LBO, splenic rupture, biliary disease, IBD, IBS, PUD, hepatitis, STD, ovarian/testicular torsion, electrolyte disturbance, DKA, dehydration, acute kidney injury, renal failure, cholecystitis, cholelithiasis, choledocholithiasis, abdominal pain of  unknown etiology. The emergent differential diagnosis of chest pain includes: Acute coronary syndrome, pericarditis, aortic dissection, pulmonary embolism, tension pneumothorax, and esophageal rupture. Other urgent/non-acute considerations include, but are not limited to: chronic angina, aortic stenosis, cardiomyopathy, myocarditis, mitral valve prolapse, pulmonary hypertension, hypertrophic obstructive cardiomyopathy (HOCM), aortic insufficiency, right ventricular hypertrophy, pneumonia, pleuritis, bronchitis, pneumothorax, tumor, gastroesophageal reflux disease (GERD), esophageal spasm, Mallory-Weiss syndrome, peptic ulcer disease, biliary disease, pancreatitis, functional gastrointestinal pain, cervical or  thoracic disk disease or arthritis, shoulder arthritis, costochondritis, subacromial bursitis, anxiety or panic attack, herpes zoster, breast disorders, chest wall tumors, thoracic outlet syndrome, mediastinitis.   Initial Plan:  Screening labs including CBC and Metabolic panel to evaluate for infectious or metabolic etiology of disease.  Lipase to evaluate for pancreatitis Urinalysis with reflex culture ordered to evaluate for UTI or relevant urologic/nephrologic pathology.  CT chest/abd/pelvis to evaluate for intra-abdominal and intra-thoracic pathology EKG and troponin to evaluate for cardiac pathology COVID swab to evaluate for viral etiology Symptomatic management Objective evaluation as reviewed   Initial Study Results:   Laboratory  All laboratory results reviewed without evidence of clinically relevant pathology.   Exceptions include: Glucose 223, anion gap 18, lipase 155, hemoglobin 11.6, troponin 20-21  EKG EKG was reviewed independently. NSR. Some diffuse STE, low suspicion for pericarditis given pt's presentation. LAD.   Radiology:  All images reviewed independently. Agree with radiology report at this time.   CT CHEST ABDOMEN PELVIS W CONTRAST  Result Date: 07/26/2022 CLINICAL DATA:  Lower chest and upper abdominal pain 2 weeks, now with  nausea, vomiting and sweats, recent diarrhea * Tracking Code: BO * EXAM: CT CHEST, ABDOMEN, AND PELVIS WITH CONTRAST TECHNIQUE: Multidetector CT imaging of the chest, abdomen and pelvis was performed following the standard protocol during bolus administration of intravenous contrast. RADIATION DOSE REDUCTION: This exam was performed according to the departmental dose-optimization program which includes automated exposure control, adjustment of the mA and/or kV according to patient size and/or use of iterative reconstruction technique. CONTRAST:  75mL OMNIPAQUE IOHEXOL 350 MG/ML SOLN COMPARISON:  None Available. FINDINGS: CT CHEST FINDINGS  Cardiovascular: No significant vascular findings. Cardiomegaly. No pericardial effusion. Mediastinum/Nodes: No enlarged mediastinal, hilar, or axillary lymph nodes. Thyroid gland, trachea, and esophagus demonstrate no significant findings. Lungs/Pleura: Diffuse bilateral bronchial wall thickening. Dependent bibasilar scarring or atelectasis. Mixed solid and cystic nodule of the anterior right upper lobe measuring 0.9 x 0.9 cm, solid component of the left aspect measuring 0.6 cm (series 5, image 71). Additional small solid nodule just posteriorly measuring 0.6 cm (series 5, image 70). Minimal centrilobular and paraseptal emphysema. No pleural effusion or pneumothorax. Musculoskeletal: No chest wall abnormality. No acute osseous findings. CT ABDOMEN PELVIS FINDINGS Hepatobiliary: Hyperenhancing subcapsular lesion of the posterior liver dome measuring 2.6 x 1.9 cm (series 3, image 52). Additional ill-defined hypodense lesion of the anterior left lobe of the liver, hepatic segment IVA, measuring 1.7 x 1.6 cm (series 3, image 47). Contracted gallbladder. No gallstones, gallbladder wall thickening, or biliary dilatation. Pancreas: Unremarkable. No pancreatic ductal dilatation or surrounding inflammatory changes. Spleen: Normal in size without significant abnormality. Adrenals/Urinary Tract: Adrenal glands are unremarkable. Simple, benign left renal cortical cysts, for which no further follow-up or characterization is required. Kidneys are otherwise normal, without renal calculi, solid lesion, or hydronephrosis. Bladder is unremarkable. Stomach/Bowel: Mucosal thickening of the gastric antrum and pylorus (series 3, image 63). Appendix appears normal. No evidence of bowel wall thickening, distention, or inflammatory changes. Descending and sigmoid diverticulosis. Vascular/Lymphatic: Scattered aortic atherosclerosis. Enlarged portacaval lymph nodes measuring up to 2.9 x 1.7 cm (series 3, image 63). Enlarged bilateral inguinal  lymph nodes measuring up to 2.7 x 1.4 cm (series 3, image 120). Reproductive: No mass or other abnormality. Other: No abdominal wall hernia or abnormality. No ascites. Musculoskeletal: No acute osseous findings. IMPRESSION: 1. Mucosal thickening of the gastric antrum and pylorus, suggestive of nonspecific infectious or inflammatory gastritis. 2. Diverticulosis without acute diverticulitis. 3. Mixed solid and cystic pulmonary nodule of the anterior right upper lobe measuring 0.9 x 0.9 cm, solid component of the left aspect measuring 0.6 cm. Although possibly infectious or inflammatory, this nodule is modestly concerning for primary lung malignancy. Additional small solid nodule just posteriorly measuring 0.6 cm. Follow-up non-contrast CT recommended at 3-6 months to confirm persistence. If unchanged, and solid component remains <6 mm, annual CT is recommended until 5 years of stability has been established. If persistent these nodules should be considered highly suspicious if the solid component of the nodule is 6 mm or greater in size and enlarging. This recommendation follows the consensus statement: Guidelines for Management of Incidental Pulmonary Nodules Detected on CT Images: From the Fleischner Society 2017; Radiology 2017; 284:228-243. 4. Hyperenhancing subcapsular lesion of the posterior liver dome measuring 2.6 x 1.9 cm. Additional ill-defined hypodense lesion of the anterior left lobe of the liver, hepatic segment IVA, measuring 1.7 x 1.6 cm. Although possibly benign, and particularly the posterior lesion possibly a flash filling hemangioma, these are incompletely characterized on this single phase contrast enhanced examination. Recommend multiphasic contrast enhanced MRI  to further evaluate. 5. Enlarged portacaval lymph nodes measuring up to 2.9 x 1.7 cm. Enlarged bilateral inguinal lymph nodes measuring up to 2.7 x 1.4 cm. These are nonspecific and may be reactive, however are suspicious for metastatic  disease. 6. Cardiomegaly. 7. Minimal emphysema and diffuse bilateral bronchial wall thickening. Aortic Atherosclerosis (ICD10-I70.0) and Emphysema (ICD10-J43.9). Electronically Signed   By: Jearld Lesch M.D.   On: 07/26/2022 15:42   DG Chest 2 View  Result Date: 07/26/2022 CLINICAL DATA:  cp EXAM: CHEST - 2 VIEW COMPARISON:  CXR 04/10/06 FINDINGS: No pleural effusion. No pneumothorax. No focal airspace opacity. Normal cardiac and mediastinal contours. No radiographically apparent displaced rib fractures. Visualized upper abdomen is unremarkable. Vertebral body heights are maintained. IMPRESSION: No focal airspace opacity Electronically Signed   By: Lorenza Cambridge M.D.   On: 07/26/2022 12:06   DG Abd 2 Views  Result Date: 07/26/2022 CLINICAL DATA:  Abdominal pain, fever and constipation EXAM: ABDOMEN - 2 VIEW COMPARISON:  None Available. FINDINGS: No dilated large or small bowel. Normal stool volume. Gas and stool in the rectum. No pathologic calcifications. IMPRESSION: No acute abdominal findings.  Normal volume of stool. Electronically Signed   By: Genevive Bi M.D.   On: 07/26/2022 09:55      Consults: Case discussed with Dr. Marisue Humble who accepts admission. .   Final Assessment and Plan:   52 year old male presents to the ED complaining of chest and abdominal pain as well as frequent nausea and vomiting.  He does have epigastric tenderness on exam.  He is slightly diaphoretic with dry mucous membranes.  Workup initiated as above for further assessment.  No diagnosed medical history.  Patient has tried over-the-counter medications at home without relief.  Possible sick contact.  He has an anion gap at 18, suspect secondary to dehydration.  He does have an elevated troponin at 20 with repeat at 21.  No previous to compare.  EKG with some diffuse ST elevations and a possible pericarditis.  Low suspicion for this.  No pericardial effusion.  No friction rub.  Chest pain described as an achy, body ache  type pain and not sharp or pleuritic.  No change with leaning forward.  Hemoglobin 11.6.  No significant electrolyte disturbance.  He does have an elevated lipase at 155.  States that he intermittently occasionally drinks but not heavily or frequently.  Chest x-ray with no focal airspace disease.  CT chest abdomen pelvis ordered for further evaluation of patient's symptoms.  He is maintaining oxygen saturation on room air.  Initially afebrile but temperature did increase to 100.3 Fahrenheit.  Multiple antiemetics and medications for pain control given that patient continues to have persistent symptoms.  CT chest abdomen pelvis concerning for metastatic disease including nodules in the lungs, lesions in the liver, and inguinal lymphadenopathy.  Discussed these findings with patient.  Though not definitive, discussed with patient that they are likely and he will require further workup and likely treatment of this condition.  He is a current smoker, stating at his heaviest he is smoked for the pack of cigarettes daily.  No surgical abdominal process identified.  With finding on CT scan difficulty controlling patient's symptoms, consulted for medical admission and was accepted by Dr. Verna Czech.  Patient stable at time of admission.   Clinical Impression:  1. Intractable nausea and vomiting   2. Acute pancreatitis, unspecified complication status, unspecified pancreatitis type   3. Dehydration   4. Acute gastritis without hemorrhage, unspecified gastritis type  5. Pulmonary nodule   6. Hepatic lesion   7. Pulmonary emphysema, unspecified emphysema type (HCC)   8. Intractable pain      Admit        Final Clinical Impression(s) / ED Diagnoses Final diagnoses:  Acute pancreatitis, unspecified complication status, unspecified pancreatitis type  Dehydration  Intractable nausea and vomiting  Acute gastritis without hemorrhage, unspecified gastritis type  Pulmonary nodule  Hepatic lesion  Pulmonary  emphysema, unspecified emphysema type (HCC)  Intractable pain    Rx / DC Orders ED Discharge Orders     None         Tonette Lederer, PA-C 07/26/22 1644    Benjiman Core, MD 07/28/22 1317

## 2022-07-26 NOTE — ED Triage Notes (Signed)
Pt presents to office for abdomen pain and nausea x 3 weeks. Pt reports his whole body hurts.

## 2022-07-26 NOTE — Assessment & Plan Note (Addendum)
Serial EKGs were obtained in the ED due to diffuse STE (though minimal) and diaphoresis. He denies any CP and Trops trended flat 20>21. Not especially concerned for pericarditis given no CP.  - Will repeat EKG again to monitor for any dynamic changes - Repeat Trop/EKG if develops CP  - Will check one more troponin

## 2022-07-26 NOTE — ED Triage Notes (Signed)
Pt came in via POV d/t abd pain with lower CP that radiates to his back, & HA continuous since Fathers Day. Endorses n/v since last night as well, A/Ox4, rate [place] 10/10 while in triage.

## 2022-07-26 NOTE — Hospital Course (Signed)
Robert Mccoy is a 52 y.o.male with no known medical history who was admitted to the [facility] Medicine Teaching Service at [facility] for experiencing 2 weeks of B with concerning findings for malignancy found on his ED CT abdomen/pelvis. His hospital course is detailed below:  Abdominal Pain Patient presented with abdominal pain and night sweats for 2 weeks. In the ED, patient was found to have evidence of dehydration with an anion gap to 18, he was given fluids.  CT with findings concerning for metastatic disease in the lungs, lesion, inguinal lymph nodes. For this reason, family medicine service was consulted to admit. CT revealed lymph nodes suspicious for metastatic disease, mucosal thickening of the stomach, primary liver lesion which was followed up with an MRI that revealed benign hemangiomas.    Other chronic conditions were medically managed with home medications and formulary alternatives as necessary (***)  PCP Follow-up Recommendations:

## 2022-07-26 NOTE — Progress Notes (Signed)
FMTS Interim Progress Note  **Delayed entry**  S: Anticipation of the bedtime for nighttime rounds with Dr. Georg Ruddle.  Patient walking around the room comfortably.   that reports mild epigastric pain that feels like indigestion.  Denies pain radiating to the back.  Denies chest pressure, shortness of breath.  He ate almost all of his dinner.  Denies nausea or vomiting.  O: BP 106/78   Pulse 87   Temp 100 F (37.8 C) (Oral)   Resp 18   SpO2 97%   General: No distress, pleasant, ambulates easily CV: Regular rate rhythm Respiratory: Normal work of breathing on room air Abdomen: Firm, palpable nodule at inferior sternum.  Mildly tender to palpation in Epigastric region.  No rebound or guarding. Neuro: Awake, alert, oriented, no focal deficits  A/P: Abdominal pain with abnormal CT scan chest, abdomen, pelvis.  Hemodynamically stable.  Clinically, no diaphoresis or acute distress at this time.  Patient reporting indigestion -Famotidine 20 mg.  Daily for heartburn/indigestion -Discussed imaging results and plan for further evaluation with likely biopsy.  Patient was unaware of results. -GI consult in the a.m., per day team  Abnormal EKG Troponin 20>21>26.  No concern for ACS at this time. -Awaiting repeat EKG -If recurrent chest pain, will trend troponin  Labs and orders reviewed.  Plan otherwise per day team notes.  Darral Dash, DO 07/26/2022, 11:29 PM PGY-3, [facility] Family Medicine Service pager [phone]

## 2022-07-26 NOTE — ED Notes (Signed)
Patient is being discharged from the Urgent Care and sent to the Emergency Department via POV . Per Fonnie Mu, NP, patient is in need of higher level of care due to further evaluation needed. Patient is aware and verbalizes understanding of plan of care.  Vitals:   07/26/22 0905  BP: 128/88  Pulse: (!) 101  Resp: 16  Temp: 99.5 F (37.5 C)  SpO2: 96%

## 2022-07-26 NOTE — Discharge Instructions (Addendum)
Your abdominal xray does not show blockage and only normal stool per the radiologist Due to the mass or fullness I feel on your left lower abdomen and how long you have been having the pain, You need to have blood work and possibly a cat scan. Since you dont have a primary care doctor, it is best that go to the ER right now.  You may take some Ibuprofen as needed for your wrist pain.

## 2022-07-26 NOTE — ED Notes (Signed)
ED TO INPATIENT HANDOFF REPORT  ED Nurse Name and Phone #: Victorino Dike RN [phone]  S Name/Age/Gender Robert Mccoy 52 y.o. male Room/Bed: 011C/011C  Code Status   Code Status: Full Code  Home/SNF/Other Home Patient oriented to: self, place, time, and situation Is this baseline? Yes   Triage Complete: Triage complete  Chief Complaint Abdominal mass [R19.00]  Triage Note Pt came in via POV d/t abd pain with lower CP that radiates to his back, & HA continuous since Fathers Day. Endorses n/v since last night as well, A/Ox4, rate [place] 10/10 while in triage.    Allergies No Known Allergies  Level of Care/Admitting Diagnosis ED Disposition     ED Disposition  Admit   Condition  --   Comment  Hospital Area: [facility] [[id number]]  Level of Care: Med-Surg [16]  May place patient in observation at [facility] or [facility] if equivalent level of care is available:: No  Covid Evaluation: Asymptomatic - no recent exposure (last 10 days) testing not required  Diagnosis: Abdominal mass [454098]  Admitting Physician: Alicia Amel [[id number]]  Attending Physician: Nestor Ramp [[id number]]          B Medical/Surgery History History reviewed. No pertinent past medical history. History reviewed. No pertinent surgical history.   A IV Location/Drains/Wounds Patient Lines/Drains/Airways Status     Active Line/Drains/Airways     Name Placement date Placement time Site Days   Peripheral IV 07/26/22 20 G Left Antecubital 07/26/22  1309  Antecubital  less than 1            Intake/Output Last 24 hours  Intake/Output Summary (Last 24 hours) at 07/26/2022 1802 Last data filed at 07/26/2022 1633 Gross per 24 hour  Intake 1000 ml  Output --  Net 1000 ml    Labs/Imaging Results for orders placed or performed during the hospital encounter of 07/26/22 (from the past 48 hour(s))  Basic metabolic panel     Status: Abnormal   Collection Time: 07/26/22  11:34 AM  Result Value Ref Range   Sodium 135 135 - 145 mmol/L   Potassium 4.5 3.5 - 5.1 mmol/L    Comment: HEMOLYSIS AT THIS LEVEL MAY AFFECT RESULT   Chloride 100 98 - 111 mmol/L   CO2 17 (L) 22 - 32 mmol/L   Glucose, Bld 223 (H) 70 - 99 mg/dL    Comment: Glucose reference range applies only to samples taken after fasting for at least 8 hours.   BUN 7 6 - 20 mg/dL   Creatinine, Ser 2.95 0.61 - 1.24 mg/dL   Calcium 9.3 8.9 - 62.1 mg/dL   GFR, Estimated >30 >86 mL/min    Comment: (NOTE) Calculated using the CKD-EPI Creatinine Equation (2021)    Anion gap 18 (H) 5 - 15    Comment: Performed at [facility], [address], [place], [place] [address]  CBC     Status: Abnormal   Collection Time: 07/26/22 11:34 AM  Result Value Ref Range   WBC 7.3 4.0 - 10.5 K/uL   RBC 3.87 (L) 4.22 - 5.81 MIL/uL   Hemoglobin 11.6 (L) 13.0 - 17.0 g/dL   HCT 96.2 (L) 95.2 - 84.1 %   MCV 98.2 80.0 - 100.0 fL   MCH 30.0 26.0 - 34.0 pg   MCHC 30.5 30.0 - 36.0 g/dL   RDW 32.4 40.1 - 02.7 %   Platelets 334 150 - 400 K/uL   nRBC 0.0 0.0 -  0.2 %    Comment: Performed at [facility], [address], [place], [place] [address]  Troponin I (High Sensitivity)     Status: Abnormal   Collection Time: 07/26/22 11:34 AM  Result Value Ref Range   Troponin I (High Sensitivity) 20 (H) <18 ng/L    Comment: (NOTE) Elevated high sensitivity troponin I (hsTnI) values and significant  changes across serial measurements may suggest ACS but many other  chronic and acute conditions are known to elevate hsTnI results.  Refer to the "Links" section for chest pain algorithms and additional  guidance. Performed at [facility], [address], [place], [place] [address]   Lipase, blood     Status: Abnormal   Collection Time: 07/26/22 11:34 AM  Result Value Ref Range   Lipase 155 (H) 11 - 51 U/L    Comment: Performed at [facility], [address] [place], [place] [address]  Troponin I (High  Sensitivity)     Status: Abnormal   Collection Time: 07/26/22  1:07 PM  Result Value Ref Range   Troponin I (High Sensitivity) 21 (H) <18 ng/L    Comment: (NOTE) Elevated high sensitivity troponin I (hsTnI) values and significant  changes across serial measurements may suggest ACS but many other  chronic and acute conditions are known to elevate hsTnI results.  Refer to the "Links" section for chest pain algorithms and additional  guidance. Performed at [facility], [address] [place], [place] [address]   Hepatic function panel     Status: Abnormal   Collection Time: 07/26/22  3:10 PM  Result Value Ref Range   Total Protein 7.4 6.5 - 8.1 g/dL   Albumin 2.9 (L) 3.5 - 5.0 g/dL   AST 46 (H) 15 - 41 U/L   ALT 60 (H) 0 - 44 U/L   Alkaline Phosphatase 140 (H) 38 - 126 U/L   Total Bilirubin 0.9 0.3 - 1.2 mg/dL   Bilirubin, Direct 0.3 (H) 0.0 - 0.2 mg/dL   Indirect Bilirubin 0.6 0.3 - 0.9 mg/dL    Comment: Performed at [facility], [address], [place], [place] [address]   CT CHEST ABDOMEN PELVIS W CONTRAST  Result Date: 07/26/2022 CLINICAL DATA:  Lower chest and upper abdominal pain 2 weeks, now with nausea, vomiting and sweats, recent diarrhea * Tracking Code: BO * EXAM: CT CHEST, ABDOMEN, AND PELVIS WITH CONTRAST TECHNIQUE: Multidetector CT imaging of the chest, abdomen and pelvis was performed following the standard protocol during bolus administration of intravenous contrast. RADIATION DOSE REDUCTION: This exam was performed according to the departmental dose-optimization program which includes automated exposure control, adjustment of the mA and/or kV according to patient size and/or use of iterative reconstruction technique. CONTRAST:  75mL OMNIPAQUE IOHEXOL 350 MG/ML SOLN COMPARISON:  None Available. FINDINGS: CT CHEST FINDINGS Cardiovascular: No significant vascular findings. Cardiomegaly. No pericardial effusion. Mediastinum/Nodes: No enlarged mediastinal, hilar, or  axillary lymph nodes. Thyroid gland, trachea, and esophagus demonstrate no significant findings. Lungs/Pleura: Diffuse bilateral bronchial wall thickening. Dependent bibasilar scarring or atelectasis. Mixed solid and cystic nodule of the anterior right upper lobe measuring 0.9 x 0.9 cm, solid component of the left aspect measuring 0.6 cm (series 5, image 71). Additional small solid nodule just posteriorly measuring 0.6 cm (series 5, image 70). Minimal centrilobular and paraseptal emphysema. No pleural effusion or pneumothorax. Musculoskeletal: No chest wall abnormality. No acute osseous findings. CT ABDOMEN PELVIS FINDINGS Hepatobiliary: Hyperenhancing subcapsular lesion  of the posterior liver dome measuring 2.6 x 1.9 cm (series 3, image 52). Additional ill-defined hypodense lesion of the anterior left lobe of the liver, hepatic segment IVA, measuring 1.7 x 1.6 cm (series 3, image 47). Contracted gallbladder. No gallstones, gallbladder wall thickening, or biliary dilatation. Pancreas: Unremarkable. No pancreatic ductal dilatation or surrounding inflammatory changes. Spleen: Normal in size without significant abnormality. Adrenals/Urinary Tract: Adrenal glands are unremarkable. Simple, benign left renal cortical cysts, for which no further follow-up or characterization is required. Kidneys are otherwise normal, without renal calculi, solid lesion, or hydronephrosis. Bladder is unremarkable. Stomach/Bowel: Mucosal thickening of the gastric antrum and pylorus (series 3, image 63). Appendix appears normal. No evidence of bowel wall thickening, distention, or inflammatory changes. Descending and sigmoid diverticulosis. Vascular/Lymphatic: Scattered aortic atherosclerosis. Enlarged portacaval lymph nodes measuring up to 2.9 x 1.7 cm (series 3, image 63). Enlarged bilateral inguinal lymph nodes measuring up to 2.7 x 1.4 cm (series 3, image 120). Reproductive: No mass or other abnormality. Other: No abdominal wall hernia or  abnormality. No ascites. Musculoskeletal: No acute osseous findings. IMPRESSION: 1. Mucosal thickening of the gastric antrum and pylorus, suggestive of nonspecific infectious or inflammatory gastritis. 2. Diverticulosis without acute diverticulitis. 3. Mixed solid and cystic pulmonary nodule of the anterior right upper lobe measuring 0.9 x 0.9 cm, solid component of the left aspect measuring 0.6 cm. Although possibly infectious or inflammatory, this nodule is modestly concerning for primary lung malignancy. Additional small solid nodule just posteriorly measuring 0.6 cm. Follow-up non-contrast CT recommended at 3-6 months to confirm persistence. If unchanged, and solid component remains <6 mm, annual CT is recommended until 5 years of stability has been established. If persistent these nodules should be considered highly suspicious if the solid component of the nodule is 6 mm or greater in size and enlarging. This recommendation follows the consensus statement: Guidelines for Management of Incidental Pulmonary Nodules Detected on CT Images: From the Fleischner Society 2017; Radiology 2017; 284:228-243. 4. Hyperenhancing subcapsular lesion of the posterior liver dome measuring 2.6 x 1.9 cm. Additional ill-defined hypodense lesion of the anterior left lobe of the liver, hepatic segment IVA, measuring 1.7 x 1.6 cm. Although possibly benign, and particularly the posterior lesion possibly a flash filling hemangioma, these are incompletely characterized on this single phase contrast enhanced examination. Recommend multiphasic contrast enhanced MRI to further evaluate. 5. Enlarged portacaval lymph nodes measuring up to 2.9 x 1.7 cm. Enlarged bilateral inguinal lymph nodes measuring up to 2.7 x 1.4 cm. These are nonspecific and may be reactive, however are suspicious for metastatic disease. 6. Cardiomegaly. 7. Minimal emphysema and diffuse bilateral bronchial wall thickening. Aortic Atherosclerosis (ICD10-I70.0) and  Emphysema (ICD10-J43.9). Electronically Signed   By: Jearld Lesch M.D.   On: 07/26/2022 15:42   DG Chest 2 View  Result Date: 07/26/2022 CLINICAL DATA:  cp EXAM: CHEST - 2 VIEW COMPARISON:  CXR 04/10/06 FINDINGS: No pleural effusion. No pneumothorax. No focal airspace opacity. Normal cardiac and mediastinal contours. No radiographically apparent displaced rib fractures. Visualized upper abdomen is unremarkable. Vertebral body heights are maintained. IMPRESSION: No focal airspace opacity Electronically Signed   By: Lorenza Cambridge M.D.   On: 07/26/2022 12:06   DG Abd 2 Views  Result Date: 07/26/2022 CLINICAL DATA:  Abdominal pain, fever and constipation EXAM: ABDOMEN - 2 VIEW COMPARISON:  None Available. FINDINGS: No dilated large or small bowel. Normal stool volume. Gas and stool in the rectum. No pathologic calcifications. IMPRESSION: No acute abdominal findings.  Normal volume  of stool. Electronically Signed   By: Genevive Bi M.D.   On: 07/26/2022 09:55    Pending Labs Unresulted Labs (From admission, onward)     Start     Ordered   07/27/22 0500  CBC  Tomorrow morning,   R        07/26/22 1746   07/27/22 0500  Comprehensive metabolic panel  Tomorrow morning,   R        07/26/22 1746   07/26/22 1746  Hemoglobin A1c  Once,   R        07/26/22 1746   07/26/22 1746  TSH  Once,   R        07/26/22 1746   07/26/22 1746  Lipid panel  Once,   R        07/26/22 1746   07/26/22 1745  HIV Antibody (routine testing w rflx)  (HIV Antibody (Routine testing w reflex) panel)  Once,   R        07/26/22 1746   07/26/22 1740  PSA  Once,   URGENT        07/26/22 1740   07/26/22 1445  SARS Coronavirus 2 by RT PCR (hospital order, performed in [facility] hospital lab) *cepheid single result test* Anterior Nasal Swab  Once,   URGENT        07/26/22 1444   07/26/22 1112  Urinalysis, Routine w reflex microscopic -Urine, Clean Catch  Once,   URGENT       Question:  Specimen Source  Answer:  Urine, Clean  Catch   07/26/22 1111            Vitals/Pain Today's Vitals   07/26/22 1415 07/26/22 1430 07/26/22 1605 07/26/22 1633  BP: 129/80 134/79 131/80   Pulse: 83 62 89   Resp: 20 18 18    Temp:    100.3 F (37.9 C)  TempSrc:      SpO2: 93% 100% 97%   PainSc:        Isolation Precautions No active isolations  Medications Medications  lactated ringers infusion ( Intravenous New Bag/Given 07/26/22 1608)  enoxaparin (LOVENOX) injection 40 mg (has no administration in time range)  ondansetron (ZOFRAN) injection 4 mg (4 mg Intravenous Given 07/26/22 1314)  lactated ringers bolus 1,000 mL (0 mLs Intravenous Stopped 07/26/22 1633)  pantoprazole (PROTONIX) injection 40 mg (40 mg Intravenous Given 07/26/22 1326)  ketorolac (TORADOL) 15 MG/ML injection 15 mg (15 mg Intravenous Given 07/26/22 1314)  iohexol (OMNIPAQUE) 350 MG/ML injection 75 mL (75 mLs Intravenous Contrast Given 07/26/22 1453)  metoCLOPramide (REGLAN) injection 10 mg (10 mg Intravenous Given 07/26/22 1601)  diphenhydrAMINE (BENADRYL) injection 25 mg (25 mg Intravenous Given 07/26/22 1600)  morphine (PF) 4 MG/ML injection 4 mg (4 mg Intravenous Given 07/26/22 1601)  acetaminophen (TYLENOL) tablet 1,000 mg (1,000 mg Oral Given 07/26/22 1735)    Mobility walks     Focused Assessments Pulmonary Assessment Handoff:  Lung sounds:   O2 Device: Room Air      R Recommendations: See Admitting Provider Note  Report given to:   Additional Notes:

## 2022-07-26 NOTE — Assessment & Plan Note (Signed)
Likely in the setting of poor p.o. intake in the setting of longstanding abdominal pain as above.  Anion gap elevated 18 on admission.  Dark urine noted on bedside table in room.  Patient was status post 1 L LR bolus and receiving LR at 100 mL/h.  Fluid status improved at the time of our evaluation. -Continue LR at 100 mL/h overnight

## 2022-07-26 NOTE — ED Provider Notes (Signed)
[facility]    CSN: [id number] Arrival date & time: 07/26/22  0272      History   Chief Complaint Chief Complaint  Patient presents with   Abdominal Pain    HPI Robert Mccoy is a 52 y.o. male who presents with onset of abdominal pain and nausea 3 weeks ago. The pain is located across lower abdomen L>R. Had diarrhea a few days ago, after taking Cold and Flu Tylenol. Today has small BM but was very hard to pass. Has had constipation. He vomited x 2 today. He has also had sweats across his lower chest and L wrist ache with hx of trauma since abdominal pain started. Denies UTI symptoms. Has not checked his temp. Denies ever having a colonoscopy.     History reviewed. No pertinent past medical history.  There are no problems to display for this patient.   History reviewed. No pertinent surgical history.     Home Medications    Prior to Admission medications   Not on File    Family History History reviewed. No pertinent family history.  Social History Social History   Tobacco Use   Smoking status: Former   Smokeless tobacco: Former  Substance Use Topics   Alcohol use: Yes    Comment: once a week/beers 16   Drug use: Not Currently     Allergies   Patient has no known allergies.   Review of Systems Review of Systems  Constitutional:  Positive for appetite change and diaphoresis. Negative for activity change, chills, fatigue and fever.  HENT:  Negative for congestion.   Respiratory:  Negative for cough, chest tightness and shortness of breath.        Has felt soreness  across lower chest when he bends over  Cardiovascular:  Negative for chest pain.  Gastrointestinal:  Positive for abdominal pain, constipation, nausea and vomiting. Negative for blood in stool.  Genitourinary:  Negative for dysuria, frequency and urgency.  Musculoskeletal:  Negative for myalgias.       L wrist pain without hx of injury. He is R hand dominant  Skin:   Negative for rash and wound.     Physical Exam Triage Vital Signs ED Triage Vitals [07/26/22 0905]  Enc Vitals Group     BP 128/88     Pulse Rate (!) 101     Resp 16     Temp 99.5 F (37.5 C)     Temp Source Oral     SpO2 96 %     Weight      Height      Head Circumference      Peak Flow      Pain Score      Pain Loc      Pain Edu?      Excl. in GC?    No data found.  Updated Vital Signs BP 128/88 (BP Location: Left Arm)   Pulse (!) 101   Temp 99.5 F (37.5 C) (Oral)   Resp 16   SpO2 96%   Visual Acuity Right Eye Distance:   Left Eye Distance:   Bilateral Distance:    Right Eye Near:   Left Eye Near:    Bilateral Near:     Physical Exam Vitals and nursing note reviewed.  Constitutional:      General: He is not in acute distress.    Appearance: He is not ill-appearing, toxic-appearing or diaphoretic.  HENT:     Head: Normocephalic.  Eyes:  General: No scleral icterus. Cardiovascular:     Rate and Rhythm: Regular rhythm. Tachycardia present.     Heart sounds: No murmur heard. Pulmonary:     Effort: Pulmonary effort is normal.  Abdominal:     General: Abdomen is protuberant. Bowel sounds are decreased.     Palpations: Abdomen is soft. There is mass. There is no hepatomegaly or splenomegaly.     Tenderness: There is generalized abdominal tenderness. There is no guarding or rebound. Negative signs include psoas sign.     Hernia: No hernia is present.     Comments: Has fullness present on LLQ which is tender  Musculoskeletal:     Comments: L WRIST- no swelling or ecchymosis noted. Has normal ROM. Has local tenderness on dorsal mid wrist with palpation on central ligament.   Skin:    General: Skin is warm and dry.     Findings: No rash.  Neurological:     Mental Status: He is alert and oriented to person, place, and time.  Psychiatric:        Mood and Affect: Mood normal.        Behavior: Behavior normal.      UC Treatments / Results   Labs (all labs ordered are listed, but only abnormal results are displayed) Labs Reviewed - No data to display  EKG   Radiology DG Abd 2 Views  Result Date: 07/26/2022 CLINICAL DATA:  Abdominal pain, fever and constipation EXAM: ABDOMEN - 2 VIEW COMPARISON:  None Available. FINDINGS: No dilated large or small bowel. Normal stool volume. Gas and stool in the rectum. No pathologic calcifications. IMPRESSION: No acute abdominal findings.  Normal volume of stool. Electronically Signed   By: Genevive Bi M.D.   On: 07/26/2022 09:55    Procedures Procedures (including critical care time)  Medications Ordered in UC Medications  ondansetron (ZOFRAN) injection 4 mg (has no administration in time range)    Initial Impression / Assessment and Plan / UC Course  I have reviewed the triage vital signs and the nursing notes. He was given Zofran 4 mg as noted.  Pertinent  imaging results that were available during my care of the patient were reviewed by me and considered in my medical decision making (see chart for details).  Abdominal pain N/V LLL abdominal mass   I sent him to ER for further work up  Final Clinical Impressions(s) / UC Diagnoses   Final diagnoses:  Nausea and vomiting, unspecified vomiting type  Lower abdominal pain  Fever, unspecified  Left wrist tendonitis     Discharge Instructions      Your abdominal xray does not show blockage and only normal stool per the radiologist Due to the mass or fullness I feel on your left lower abdomen and how long you have been having the pain, You need to have blood work and possibly a cat scan. Since you dont have a primary care doctor, it is best that go to the ER right now.  You may take some Ibuprofen as needed for your wrist pain.      ED Prescriptions   None    PDMP not reviewed this encounter.   Garey Ham, PA-C 07/26/22 1017

## 2022-07-26 NOTE — H&P (Signed)
Hospital Admission History and Physical Service Pager: [phone]  Patient name: Robert Mccoy Medical record number: [id number] Date of Birth: 07/31/1970 Age: 52 y.o. Gender: male  Primary Care Provider: Patient, [name] Consultants: IR Code Status: Full Code Preferred Emergency Contact: Ashmit Gambler [phone]   Chief Complaint: abdominal pain  Assessment and Plan: Robert Mccoy is a 52 y.o. male presenting with abdominal pain, fever, night sweats for the past two weeks. Differential for presentation of this includes malignancy, gastritis, systemic infection such as COVID.  Could also be pancreatitis given elevated lipase, though patient with benign abdominal exam making this much less likely. I am by far most concern for malignancy, though the primary source malignancy remains uncertain.  Lung cancer would seem likely given his history of smoking, though imaging findings raise concern for possible gastric cancer as well.  Also consider hyperthyroidism given decreased appetite and night sweats, though this would not explain the abdominal pain or his imaging findings.  Hospital Problem List      Hospital     * (Principal) Abdominal pain     Unclear etiology, though the imaging obtained in the ED is certainly  concerning for metastatic infection.  Unclear what the primary source of  this infection could be, though lung would seem likely given his smoking  history.  There is pretty impressive mucosal thickening of the stomach on  imaging, so gastric cancer cannot be ruled out.  It is also possible that  this is a primary liver lesion given his hyperenhancing 7 capsular lesion  in the posterior dome and ill-defined hypodense lesion in the anterior  left lobe.  Lipase minimally elevated to 155 but patient has been eating  and has had a fairly benign abdominal exam so doubt true pancreatitis. The enlarged portacaval lymph nodes are suspicious for metastatic  disease.   Suspect ultimately he will need biopsy. -Admit to MedSurg -Will discuss with IR in the morning to find the site most amenable to  biopsy. If IR is unable to find a suitable percutaneous window, could  consider Pulm consult for bronchoscopy -Would also favor getting GI involved to evaluate his gastric mucosal  changes.  Also note that he has never had a colonoscopy.  Consider the  possibility of primary colon cancer. -MRI of the liver to better characterize the lesions described on the CT -Will obtain a PSA -TSH due to night sweats -Patient has been away from care, does not follow with primary physician.   Therefore we will get some basic labs including A1c, lipid panel -Tylenol for fever -Compazine for nausea  -Toradol for pain        Liver lesion     Dehydration     Likely in the setting of poor p.o. intake in the setting of  longstanding abdominal pain as above.  Anion gap elevated 18 on admission.   Dark urine noted on bedside table in room.  Patient was status post 1 L  LR bolus and receiving LR at 100 mL/h.  Fluid status improved at the time  of our evaluation. -Continue LR at 100 mL/h overnight        Abnormal EKG     Serial EKGs were obtained in the ED due to diffuse STE (though minimal)  and diaphoresis. He denies any CP and Trops trended flat 20>21. Not  especially concerned for pericarditis given no CP.  - Will repeat EKG again to monitor for any dynamic changes - Repeat  Trop/EKG if develops CP  - Will check one more troponin         FEN/GI: Regular diet  VTE Prophylaxis: Lovenox  Disposition: Med-Surg   History of Present Illness:  Robert Mccoy is a 52 y.o. male presenting with upper abdominal pain for the past two weeks. He states that he has been feeling body aches, has had a fever, night sweats and has been self medicating with Tylenol Cold & Flu medication, thinking he may have the flu, this helped with the pain but his symptoms  persisted. He also reports having a decreased appetite. No issues with urinating or having bowel movements. Denies heartburn, but appreciates some "burping". Patient has been experiencing nausea and vomiting every time he tried to eat or drink.  Has been able to eat items such as chicken noodle soup.  Has been away from care for quite some time.  Denies knowledge of any chronic medical conditions.  He does smoke, though has not been able to smoke as much since this illness arose.  Smoking about half a pack per day prior.  In the ED, patient was found to have evidence of dehydration with an anion gap to 18.  He was given fluids.  CT with findings concerning for metastatic disease in the lungs, lesion, inguinal lymph nodes.  For this reason, family medicine service was consulted to admit.  Review Of Systems: Per HPI with the following additions:  Denies chest pain, palpitations, or pressure Denies SOB, cough, or wheezing Denies leg swelling Pertinent Past Medical History: None known, away from care  Remainder reviewed in history tab.   Pertinent Past Surgical History: None known Remainder reviewed in history tab.  Pertinent Social History: Tobacco use: Yes - 2 cigarettes per day during illness. But prior, 1 pack would last him 4 days Alcohol use: On/off, not daily or in excess Other Substance use: No Lives with friend  Pertinent Family History: Unrevealing Remainder reviewed in history tab.   Important Outpatient Medications: None  Remainder reviewed in medication history.   Objective: BP 112/72 (BP Location: Right Arm)   Pulse 87   Temp 98.8 F (37.1 C)   Resp 17   SpO2 98%  Exam: General: Comfortable appearing, NAD  Eyes: Sclerae anicteric ENTM: Mucous membranes moist at the time of my assessment Neck: Supple, without LAD Cardiovascular: RRR, no murmur  Respiratory: Normal WOB on RA, coarse lung sounds without foci of crackles or wheeze Gastrointestinal: Mild RUEand  epigastric TTP, perhaps minimally distended but without rebound or guarding  MSK: Without edema or deformity Derm: Without rash or excoriation  Neuro: No focal deficits  Psych: Mood and affect appropriate to situation   Labs:  CBC BMET  Recent Labs  Lab 07/26/22 1134  WBC 7.3  HGB 11.6*  HCT 38.0*  PLT 334   Recent Labs  Lab 07/26/22 1134  NA 135  K 4.5  CL 100  CO2 17*  BUN 7  CREATININE 0.80  GLUCOSE 223*  CALCIUM 9.3    Pertinent additional labs Lipase 155 EKG: Serial EKGs with minimal STE diffusely. There is also L Axis deviation. QTc 447.    Imaging Studies Performed:  DG Abd 2 Views  IMPRESSION: No acute abdominal findings.  Normal volume of stool.  DG Chest 2 Views  IMPRESSION: No focal airspace opacity  CT Chest Abdomen Pelvis   IMPRESSION: 1. Mucosal thickening of the gastric antrum and pylorus, suggestive of nonspecific infectious or inflammatory gastritis. 2. Diverticulosis without acute  diverticulitis. 3. Mixed solid and cystic pulmonary nodule of the anterior right upper lobe measuring 0.9 x 0.9 cm, solid component of the left aspect measuring 0.6 cm. Although possibly infectious or inflammatory, this nodule is modestly concerning for primary lung malignancy. Additional small solid nodule just posteriorly measuring 0.6 cm. Follow-up non-contrast CT recommended at 3-6 months to confirm persistence. If unchanged, and solid component remains <6 mm, annual CT is recommended until 5 years of stability has been established. If persistent these nodules should be considered highly suspicious if the solid component of the nodule is 6 mm or greater in size and enlarging. This recommendation follows the consensus statement: Guidelines for Management of Incidental Pulmonary Nodules Detected on CT Images: From the Fleischner Society 2017; Radiology 2017; 284:228-243. 4. Hyperenhancing subcapsular lesion of the posterior liver dome measuring 2.6 x 1.9  cm. Additional ill-defined hypodense lesion of the anterior left lobe of the liver, hepatic segment IVA, measuring 1.7 x 1.6 cm. Although possibly benign, and particularly the posterior lesion possibly a flash filling hemangioma, these are incompletely characterized on this single phase contrast enhanced examination. Recommend multiphasic contrast enhanced MRI to further evaluate. 5. Enlarged portacaval lymph nodes measuring up to 2.9 x 1.7 cm. Enlarged bilateral inguinal lymph nodes measuring up to 2.7 x 1.4 cm. These are nonspecific and may be reactive, however are suspicious for metastatic disease. 6. Cardiomegaly. 7. Minimal emphysema and diffuse bilateral bronchial wall thickening.  Independently reviewed and agree with radiologist's interpretation. The Gastric mucosal thickening is quite impressive and concerning.    Alicia Amel, MD 07/26/2022, 7:23 PM PGY-1, [facility] Family Medicine  FPTS Intern pager: [phone], text pages welcome Secure chat group [facility] Teaching Service

## 2022-07-27 ENCOUNTER — Observation Stay (HOSPITAL_COMMUNITY): Payer: No Typology Code available for payment source

## 2022-07-27 DIAGNOSIS — K259 Gastric ulcer, unspecified as acute or chronic, without hemorrhage or perforation: Secondary | ICD-10-CM | POA: Diagnosis present

## 2022-07-27 DIAGNOSIS — R935 Abnormal findings on diagnostic imaging of other abdominal regions, including retroperitoneum: Secondary | ICD-10-CM | POA: Insufficient documentation

## 2022-07-27 DIAGNOSIS — K2990 Gastroduodenitis, unspecified, without bleeding: Secondary | ICD-10-CM | POA: Diagnosis not present

## 2022-07-27 DIAGNOSIS — R9431 Abnormal electrocardiogram [ECG] [EKG]: Secondary | ICD-10-CM | POA: Diagnosis present

## 2022-07-27 DIAGNOSIS — E8809 Other disorders of plasma-protein metabolism, not elsewhere classified: Secondary | ICD-10-CM

## 2022-07-27 DIAGNOSIS — R112 Nausea with vomiting, unspecified: Secondary | ICD-10-CM | POA: Diagnosis not present

## 2022-07-27 DIAGNOSIS — K2289 Other specified disease of esophagus: Secondary | ICD-10-CM | POA: Diagnosis present

## 2022-07-27 DIAGNOSIS — K2980 Duodenitis without bleeding: Secondary | ICD-10-CM | POA: Diagnosis present

## 2022-07-27 DIAGNOSIS — D1803 Hemangioma of intra-abdominal structures: Secondary | ICD-10-CM | POA: Diagnosis present

## 2022-07-27 DIAGNOSIS — K2970 Gastritis, unspecified, without bleeding: Secondary | ICD-10-CM | POA: Diagnosis not present

## 2022-07-27 DIAGNOSIS — E860 Dehydration: Secondary | ICD-10-CM | POA: Diagnosis present

## 2022-07-27 DIAGNOSIS — D649 Anemia, unspecified: Secondary | ICD-10-CM

## 2022-07-27 DIAGNOSIS — R61 Generalized hyperhidrosis: Secondary | ICD-10-CM | POA: Diagnosis present

## 2022-07-27 DIAGNOSIS — N5089 Other specified disorders of the male genital organs: Secondary | ICD-10-CM | POA: Diagnosis present

## 2022-07-27 DIAGNOSIS — R1900 Intra-abdominal and pelvic swelling, mass and lump, unspecified site: Secondary | ICD-10-CM | POA: Insufficient documentation

## 2022-07-27 DIAGNOSIS — R509 Fever, unspecified: Secondary | ICD-10-CM | POA: Diagnosis present

## 2022-07-27 DIAGNOSIS — R748 Abnormal levels of other serum enzymes: Secondary | ICD-10-CM | POA: Diagnosis present

## 2022-07-27 DIAGNOSIS — R7989 Other specified abnormal findings of blood chemistry: Secondary | ICD-10-CM

## 2022-07-27 DIAGNOSIS — K2900 Acute gastritis without bleeding: Secondary | ICD-10-CM | POA: Diagnosis not present

## 2022-07-27 DIAGNOSIS — K118 Other diseases of salivary glands: Secondary | ICD-10-CM | POA: Diagnosis present

## 2022-07-27 DIAGNOSIS — A084 Viral intestinal infection, unspecified: Secondary | ICD-10-CM | POA: Diagnosis present

## 2022-07-27 DIAGNOSIS — F17210 Nicotine dependence, cigarettes, uncomplicated: Secondary | ICD-10-CM | POA: Diagnosis present

## 2022-07-27 DIAGNOSIS — R739 Hyperglycemia, unspecified: Secondary | ICD-10-CM | POA: Diagnosis not present

## 2022-07-27 DIAGNOSIS — J439 Emphysema, unspecified: Secondary | ICD-10-CM | POA: Diagnosis present

## 2022-07-27 DIAGNOSIS — Z1152 Encounter for screening for COVID-19: Secondary | ICD-10-CM | POA: Diagnosis not present

## 2022-07-27 DIAGNOSIS — K769 Liver disease, unspecified: Secondary | ICD-10-CM | POA: Diagnosis not present

## 2022-07-27 DIAGNOSIS — R1013 Epigastric pain: Secondary | ICD-10-CM | POA: Insufficient documentation

## 2022-07-27 DIAGNOSIS — R918 Other nonspecific abnormal finding of lung field: Secondary | ICD-10-CM | POA: Diagnosis present

## 2022-07-27 DIAGNOSIS — R1010 Upper abdominal pain, unspecified: Secondary | ICD-10-CM | POA: Diagnosis present

## 2022-07-27 DIAGNOSIS — N281 Cyst of kidney, acquired: Secondary | ICD-10-CM | POA: Diagnosis present

## 2022-07-27 DIAGNOSIS — R911 Solitary pulmonary nodule: Secondary | ICD-10-CM | POA: Diagnosis not present

## 2022-07-27 DIAGNOSIS — A5149 Other secondary syphilitic conditions: Secondary | ICD-10-CM | POA: Diagnosis present

## 2022-07-27 DIAGNOSIS — R591 Generalized enlarged lymph nodes: Secondary | ICD-10-CM | POA: Diagnosis present

## 2022-07-27 DIAGNOSIS — K449 Diaphragmatic hernia without obstruction or gangrene: Secondary | ICD-10-CM | POA: Diagnosis present

## 2022-07-27 DIAGNOSIS — R1114 Bilious vomiting: Secondary | ICD-10-CM | POA: Insufficient documentation

## 2022-07-27 LAB — RESPIRATORY PANEL BY PCR

## 2022-07-27 LAB — COMPREHENSIVE METABOLIC PANEL
ALT: 78 U/L — ABNORMAL HIGH (ref 0–44)
AST: 76 U/L — ABNORMAL HIGH (ref 15–41)
Albumin: 2.7 g/dL — ABNORMAL LOW (ref 3.5–5.0)
Alkaline Phosphatase: 148 U/L — ABNORMAL HIGH (ref 38–126)
Anion gap: 14 (ref 5–15)
BUN: 11 mg/dL (ref 6–20)
CO2: 24 mmol/L (ref 22–32)
Calcium: 9.1 mg/dL (ref 8.9–10.3)
Chloride: 96 mmol/L — ABNORMAL LOW (ref 98–111)
Creatinine, Ser: 1 mg/dL (ref 0.61–1.24)
GFR, Estimated: >60 mL/min (ref >60)
Glucose, Bld: 189 mg/dL — ABNORMAL HIGH (ref 70–99)
Potassium: 3.9 mmol/L (ref 3.5–5.1)
Sodium: 134 mmol/L — ABNORMAL LOW (ref 135–145)
Total Bilirubin: 0.8 mg/dL (ref 0.3–1.2)
Total Protein: 6.9 g/dL (ref 6.5–8.1)

## 2022-07-27 LAB — HEPATITIS PANEL, ACUTE
HCV Ab: NONREACTIVE
Hep A IgM: NONREACTIVE
Hep B C IgM: NONREACTIVE
Hepatitis B Surface Ag: NONREACTIVE

## 2022-07-27 LAB — TROPONIN I (HIGH SENSITIVITY): Troponin I (High Sensitivity): 24 ng/L — ABNORMAL HIGH (ref <18)

## 2022-07-27 LAB — CBC
HCT: 35.9 % — ABNORMAL LOW (ref 39.0–52.0)
Hemoglobin: 11.4 g/dL — ABNORMAL LOW (ref 13.0–17.0)
MCH: 29.2 pg (ref 26.0–34.0)
MCHC: 31.8 g/dL (ref 30.0–36.0)
MCV: 92.1 fL (ref 80.0–100.0)
Platelets: 357 10*3/uL (ref 150–400)
RBC: 3.9 MIL/uL — ABNORMAL LOW (ref 4.22–5.81)
RDW: 13.2 % (ref 11.5–15.5)
WBC: 5.8 10*3/uL (ref 4.0–10.5)
nRBC: 0 % (ref 0.0–0.2)

## 2022-07-27 LAB — SARS CORONAVIRUS 2 BY RT PCR: SARS Coronavirus 2 by RT PCR: NEGATIVE

## 2022-07-27 LAB — LIPASE, BLOOD: Lipase: 158 U/L — ABNORMAL HIGH (ref 11–51)

## 2022-07-27 MED ORDER — ENOXAPARIN SODIUM 40 MG/0.4ML IJ SOSY: 40.0000 mg | PREFILLED_SYRINGE | INTRAMUSCULAR | Status: DC

## 2022-07-27 MED ORDER — GADOBUTROL 1 MMOL/ML IV SOLN
9.0000 mL | Freq: Once | INTRAVENOUS | Status: AC | PRN
  Administered 2022-07-27: 9 mL via INTRAVENOUS

## 2022-07-27 MED ORDER — IOHEXOL 350 MG/ML SOLN
75.0000 mL | Freq: Once | INTRAVENOUS | Status: AC | PRN
  Administered 2022-07-27: 75 mL via INTRAVENOUS

## 2022-07-27 MED ORDER — PANTOPRAZOLE SODIUM 40 MG PO TBEC
40.0000 mg | DELAYED_RELEASE_TABLET | Freq: Every day | ORAL | Status: DC
  Administered 2022-07-28: 40 mg via ORAL
  Filled 2022-07-27: qty 1

## 2022-07-27 NOTE — Assessment & Plan Note (Addendum)
MRI Liver revealed benign hemangiomas and peripancreatic and portacaval lymph nodes

## 2022-07-27 NOTE — Assessment & Plan Note (Addendum)
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 Does not appear to be obstructing airway and no adenopathy noted. Per patient history has been there for many years, but has grown larger. - ENT consulted, appreciate recs

## 2022-07-27 NOTE — Plan of Care (Signed)

## 2022-07-27 NOTE — Assessment & Plan Note (Signed)
Serial EKGs were obtained in the ED due to diffuse STE (though minimal) and diaphoresis. He denies any CP and Trops trended flat 20>21. Not especially concerned for pericarditis given no CP.  - Will repeat EKG again to monitor for any dynamic changes - Repeat Trop/EKG if develops CP  - Will check one more troponin

## 2022-07-27 NOTE — Consult Note (Signed)
NAME:  Robert Mccoy, MRN:  [id number], DOB:  01-31-1970, LOS: 0 ADMISSION DATE:  07/26/2022, CONSULTATION DATE:  07/27/2022 REFERRING MD:  Doreene Eland, MD, CHIEF COMPLAINT:  lung nodule  History of Present Illness:   Robert Mccoy is a 52 year old gentleman with history of tobacco use disorder who presents with several symptoms including fevers, chills, night sweats and abdominal pain, as well as respiratory infections.  He notes over the past month he has had 2 episodes of respiratory tract infection and was taking over-the-counter cold and flu medication.  When he was taking this medication his fever and sweats got better, but they returned when the medication stopped.  He also notes chest heaviness with exertion.  He is a fairly active person at baseline and has always worked very active jobs Geologist, engineering.  He also notes recent weight loss although cannot give me an amount and tells me that he was eating lighter on purpose because he has been feeling sick.  He came to the hospital because he was unable to tolerate p.o. and was throwing up what he was eating.  Due to concern for malignancy he had CT chest abdomen pelvis.  Workup showed a pulmonary nodule in the right upper lobe.  He also has mucosal wall thickening in the stomach with multiple abdominal enlarged lymph nodes.  Workup also revealed a 4.3 cm parotid mass.  In general he denies recurrent respiratory disease, no childhood symptoms, denies ever being hospitalized for pneumonia or bronchitis.  He has no personal or family history of lung disease or cancer. Pertinent  Medical History  Social history: 25 years x 1/2 ppd  = 12.5 pack years.  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   Significant Hospital Events: Including procedures, antibiotic start and stop dates in addition to other pertinent events     Interim History / Subjective:    Objective   Blood pressure 119/72, pulse 98, temperature 99.2 F (37.3 C), temperature source Oral, resp. rate 17, height 5\' 11"  (1.803 m), weight 96.7 kg, SpO2 98 %.        Intake/Output Summary (Last 24 hours) at 07/27/2022 1422 Last data filed at 07/27/2022 1127 Gross per 24 hour  Intake 2492.44 ml  Output --  Net 2492.44 ml   Filed Weights   07/27/22 0945  Weight: 96.7 kg    Examination: General: well appearing, laying flat comfortably no distress HENT: mmm Lungs: ctab no wheezes or crackles, no increased wob Cardiovascular: RRR no mrg Abdomen: soft, nontender Extremities: no edema Neuro: normal speech, no focal asymmetry   Labs and imaging reviewed: CT Neck with 4.3 cm parotid mass  CT Chest reviewed shows sub cm RUL nodule and very mild centrilobular emphysema. No mediastinal or hilar adenopathy     Assessment & Plan:   Solitary pulmonary nodule Tobacco use disorder Parotid Mass Recent fevers, abdominal pain, vomiting   Given recent symptoms of respiratory tract infection would favor following this RUL nodule in three months time with repeat CT Chest. I do not think this is the cause for his other symptoms.  There is no significant hilar or mediastinal adenopathy amenable to bronchoscopic biopsy.  Agree with work up of parotid mass.  EGD may or may not be useful as it is hard to tell if his symptoms of respiratory tract infection and abdominal pain with vomiting are related as viral infection. Agree with respiratory virus panel. We discussed smoking cessation  he is contemplating this.  We will set him up to see [place] in the office with a repeat scan.    Durel Salts, MD Pulmonary and Critical Care Medicine [facility] 07/27/2022 2:45 PM Pager: see AMION  If no response to pager, please call critical care on call (see AMION) until 7pm After 7:00 pm call Elink     Labs   CBC: Recent Labs  Lab 07/26/22 1134 07/27/22 0018  WBC 7.3 5.8  HGB 11.6*  11.4*  HCT 38.0* 35.9*  MCV 98.2 92.1  PLT 334 357    Basic Metabolic Panel: Recent Labs  Lab 07/26/22 1134 07/27/22 0018  NA 135 134*  K 4.5 3.9  CL 100 96*  CO2 17* 24  GLUCOSE 223* 189*  BUN 7 11  CREATININE 0.80 1.00  CALCIUM 9.3 9.1   GFR: Estimated Creatinine Clearance: 102.5 mL/min (by C-G formula based on SCr of 1 mg/dL). Recent Labs  Lab 07/26/22 1134 07/27/22 0018  WBC 7.3 5.8    Liver Function Tests: Recent Labs  Lab 07/26/22 1510 07/27/22 0018  AST 46* 76*  ALT 60* 78*  ALKPHOS 140* 148*  BILITOT 0.9 0.8  PROT 7.4 6.9  ALBUMIN 2.9* 2.7*   Recent Labs  Lab 07/26/22 1134 07/27/22 0018  LIPASE 155* 158*   No results for input(s): "AMMONIA" in the last 168 hours.  ABG No results found for: "PHART", "PCO2ART", "PO2ART", "HCO3", "TCO2", "ACIDBASEDEF", "O2SAT"   Coagulation Profile: No results for input(s): "INR", "PROTIME" in the last 168 hours.  Cardiac Enzymes: No results for input(s): "CKTOTAL", "CKMB", "CKMBINDEX", "TROPONINI" in the last 168 hours.  HbA1C: Hgb A1c MFr Bld  Date/Time Value Ref Range Status  07/26/2022 06:09 PM 5.6 4.8 - 5.6 % Final    Comment:    (NOTE) Pre diabetes:          5.7%-6.4%  Diabetes:              >6.4%  Glycemic control for   <7.0% adults with diabetes     CBG: No results for input(s): "GLUCAP" in the last 168 hours.  Review of Systems:   +Weight loss, fevers, chills, night sweats    Past Medical History:  He,  has no past medical history on file.   Surgical History:  History reviewed. No pertinent surgical history.   Social History:   reports that he has quit smoking. He has quit using smokeless tobacco. He reports current alcohol use. He reports that he does not currently use drugs.   Family History:  His family history is not on file.   Allergies No Known Allergies   Home Medications  Prior to Admission medications   Medication Sig Start Date End Date Taking? Authorizing  Provider  acetaminophen (TYLENOL) 160 MG chewable tablet Chew 320 mg by mouth every 6 (six) hours as needed for pain.   Yes [provider]  ibuprofen (ADVIL) 200 MG tablet Take 600 mg by mouth every 4 (four) hours as needed for moderate pain.   Yes [provider]

## 2022-07-27 NOTE — TOC CM/SW Note (Signed)
Transition of Care [facility]) - Inpatient Brief Assessment   Patient Details  Name: Robert Mccoy MRN: [id number] Date of Birth: 08-16-1970  Transition of [facility]) CM/SW Contact:    Kermit Balo, RN Phone Number: 07/27/2022, 2:29 PM   Clinical Narrative: Pt admitted with abdominal pain and having GI work up.  Awaiting respiratory test results for covid and resp panel. Pt is without a PCP. TOC following.   Transition of Care Asessment: Insurance and Status: Insurance coverage has been reviewed Patient has primary care physician: No   Prior level of function:: IADL Prior/Current Home Services: No current home services   Readmission risk has been reviewed: Yes Transition of care needs: transition of care needs identified, TOC will continue to follow

## 2022-07-27 NOTE — Assessment & Plan Note (Signed)
Denies cough or SOB, but has a smoking history. In order to rule out the primary source of malignancy being form the lungs, a pulmonary consult may be beneficial. - Pulm consulted, appreciate recs

## 2022-07-27 NOTE — H&P (View-Only) (Signed)
Consultation  Referring Provider: Family medicine service/Dr. Jennette Kettle Primary Care Physician:  Patient, [name] Primary Gastroenterologist:  none/ unassigned  Reason for Consultation: Abnormal CT abdomen and pelvis, recent sweats, question fever, decreased appetite  HPI: Robert Mccoy is a 52 y.o. male, generally in good health he does not have any known chronic medical problems and is not on any prescription medications, no PCP. Patient presented to the emergency room yesterday, not feeling well over the past week and a half.  He is complaining of intermittent sweats which made him think that he had a fever but did not have any documented fever, and no chills.  He had been having intermittent sweating during the daytime and at nighttime.  Appetite has been poor but no nausea or vomiting.  He put himself on soup and liquids.  He thought he may have had the flu, and had been exposed to a friend over Father's Day weekend who had just been diagnosed with a pneumonia. He has been having some intermittent coughing which has been nonproductive and complaining of aching pains in his body.  When asked about abdominal pain he says he is just had some aching across the upper abdomen and lower chest primarily with movement.  He had had some mild diarrhea with just 1-2 bowel movements per day, no melena or hematochezia.  He had been taking some Tylenol cold and flu and intermittent ibuprofen.  When symptoms were persisting he decided to come to the emergency room for evaluation. Labs yesterday showed WBC of 7.3/hemoglobin 11.6/platelets 334 Troponin 20 and 21 Lipase 155 Albumin 2.9 T. bili 0.9/alk phos 140/AST 46/ALT 60 PSA within normal limits HIV negative Hemoglobin A1c 5.6 COVID PCR pending Acute hepatitis panel pending  ET of the chest abdomen pelvis with contrast showed mixed solid and cystic nodules in the anterior right upper lobe 1 measuring 0.9 x 0.5 cm there are a couple of other  smaller lesions.  Cannot rule out primary lung malignancy There is some diffuse bilateral bronchial wall thickening. Hyperenhancing subcapsular lesion in the posterior liver dome 2.6 x 1.9 cm and 1 other smaller lesion, contracted gallbladder, no gallstones or wall thickening, normal-appearing pancreas and spleen, some mucosal thickening of the gastric antrum and pylorus appendix appears normal, no bowel wall distention or inflammatory changes sigmoid diverticulosis, enlarged portacaval nodes measuring up to 2.9 x 1.9 cm and enlarged bilateral inguinal lymph nodes measuring 2.7 x 1.4 cm.  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  He says he has been coughing more over the past couple of days.  Any prior abdominal surgery, no prior GI evaluation.  No known family history of GI issues. He does drink alcohol socially maybe once or twice per month, not on a daily basis.  No history of hepatitis. He mentions that while he was in prison in 2018 he had had problems with right facial swelling and was told that he had a gland in his facial area that may eventually need to be removed.     History reviewed. No pertinent past medical history.  History reviewed. No pertinent surgical history.  Prior to Admission  medications   Medication Sig Start Date End Date Taking? Authorizing Provider  acetaminophen (TYLENOL) 160 MG chewable tablet Chew 320 mg by mouth every 6 (six) hours as needed for pain.   Yes [provider]  ibuprofen (ADVIL) 200 MG tablet Take 600 mg by mouth every 4 (four) hours as needed for moderate pain.   Yes  [provider]    Current Facility-Administered Medications  Medication Dose Route Frequency Provider Last Rate Last Admin   acetaminophen (TYLENOL) tablet 650 mg  650 mg Oral Q6H PRN Alicia Amel, MD   650 mg at 07/27/22 0635   enoxaparin (LOVENOX) injection 40 mg  40 mg Subcutaneous Q24H Darnelle Spangle B, MD   40 mg at 07/27/22 0939   famotidine (PEPCID) tablet 20 mg  20 mg Oral Daily PRN Darral Dash, DO       ketorolac (TORADOL) 15 MG/ML injection 15 mg  15 mg Intravenous Q6H PRN Alicia Amel, MD   15 mg at 07/27/22 4540   lactated ringers infusion   Intravenous Continuous Alicia Amel, MD 100 mL/hr at 07/27/22 1127 Infusion Verify at 07/27/22 1127   prochlorperazine (COMPAZINE) injection 10 mg  10 mg Intravenous Q6H PRN Alicia Amel, MD        Allergies as of 07/26/2022   (No Known Allergies)    History reviewed. No pertinent family history.  Social History   Socioeconomic History   Marital status: Single    Spouse name: Not on file   Number of children: Not on file   Years of education: Not on file   Highest education level: Not on file  Occupational History   Not on file  Tobacco Use   Smoking status: Former   Smokeless tobacco: Former  Substance and Sexual Activity   Alcohol use: Yes    Comment: once a week/beers 16   Drug use: Not Currently   Sexual activity: Yes  Other Topics Concern   Not on file  Social History Narrative   Not on file   Social Determinants of Health   Financial Resource Strain: Not on file  Food Insecurity: Not on file  Transportation Needs: Not on file  Physical Activity: Not on file  Stress: Not on file  Social Connections: Not on file  Intimate Partner Violence: Not on file    Review of Systems: Pertinent positive and negative review of systems were noted in the above HPI section.  All other review of systems was otherwise negative.   Physical Exam: Vital signs in last 24 hours: Temp:  [98.8 F  (37.1 C)-100.3 F (37.9 C)] 99.2 F (37.3 C) (07/02 0818) Pulse Rate:  [62-98] 98 (07/02 0818) Resp:  [13-25] 17 (07/02 0818) BP: (106-135)/(72-85) 119/72 (07/02 0818) SpO2:  [93 %-100 %] 98 % (07/02 0818) Weight:  [96.7 kg] 96.7 kg (07/02 0945)   General:   Alert,  Well-developed, well-nourished, older AA male pleasant and cooperative in NAD Head:  Normocephalic and atraumatic. Eyes:  Sclera clear, no icterus.   Conjunctiva pink. Ears:  Normal auditory acuity. Nose:  No deformity, discharge,  or lesions. Mouth:  No deformity or lesions.   Neck:  Supple; no masses or thyromegaly. Lungs:  Clear throughout to auscultation.   No wheezes, crackles, or rhonchi.  Heart:  Regular rate and rhythm; no murmurs, clicks, rubs,  or gallops. Abdomen:  Soft,nontender, BS active,nonpalp mass or hsm.   Rectal:  not done Msk:  Symmetrical without gross deformities. . Pulses:  Normal pulses noted. Extremities:  Without clubbing or edema. Neurologic:  Alert and  oriented x4;  grossly normal neurologically. Skin:  Intact without significant lesions or rashes.. Psych:  Alert and cooperative. Normal mood and affect.  Intake/Output from previous day: 07/01 0701 - 07/02 0700 In: 1000 [IV Piggyback:1000] Out: -  Intake/Output this shift: Total I/O In: 1492.4 [P.O.:360; I.V.:1132.4] Out: -   Lab Results: Recent Labs    07/26/22 1134 07/27/22 0018  WBC 7.3 5.8  HGB 11.6* 11.4*  HCT 38.0* 35.9*  PLT 334 357   BMET Recent Labs    07/26/22 1134 07/27/22 0018  NA 135 134*  K 4.5 3.9  CL 100 96*  CO2 17* 24  GLUCOSE 223* 189*  BUN 7 11  CREATININE 0.80 1.00  CALCIUM 9.3 9.1   LFT Recent Labs    07/26/22 1510 07/27/22 0018  PROT 7.4 6.9  ALBUMIN 2.9* 2.7*  AST 46* 76*  ALT 60* 78*  ALKPHOS 140* 148*  BILITOT 0.9 0.8  BILIDIR 0.3*  --   IBILI 0.6  --    PT/INR No results for input(s): "LABPROT", "INR" in the last 72 hours. Hepatitis Panel No results for input(s): "HEPBSAG",  "HCVAB", "HEPAIGM", "HEPBIGM" in the last 72 hours.   IMPRESSION:  #19  52 year old African-American male with 1 and half to 2-week history of illness with intermittent sweats, decrease in appetite, body aches mild diarrhea which has resolved and had complained of aching in his upper abdomen lower chest with movement on presentation yesterday.  Patient has previously been healthy with no known chronic medical problems.  CT imaging with multiple findings including some mixed small less than 1 cm solid and cystic nodules in the right upper lobe of the lung, there were hyperenhancing lesions in the dome of the liver, and some thickening of the gastric antrum and pylorus, enlarged portacaval nodes and enlarged inguinal nodes.  MRI of the liver shows the hyperintense liver lesions to be consistent with benign hemangiomas  Etiology of the nonspecific adenopathy is not clear, this could be infectious versus malignant  Covid  screen pending  Nonspecific antral and pyloric thickening-rule out gastroduodenitis, he has been taking some aspirin and NSAIDs recently  #2 mildly elevated LFTs chronicity not clear-acute hepatitis serologies pending  #3 mild normocytic anemia #4.  Mild hypoalbuminemia  PLAN: Diet as tolerated Once daily PPI Will discuss with Dr. Meridee Score consider EGD tomorrow morning to further evaluate stomach given thickening on CT and portacaval nodes. Will need further evaluation of the lung nodules with repeat imaging in 3 months.  GI will follow with you     Josiephine Simao EsterwoodPA-C  07/27/2022, 1:23 PM

## 2022-07-27 NOTE — Progress Notes (Signed)
Pt off unit with transport to MRI.

## 2022-07-27 NOTE — Assessment & Plan Note (Addendum)
Likely in the setting of poor PO. intake in the setting of longstanding abdominal pain as above.  Anion gap elevated 18 on admission and improved to 14 today. Fluid status improved at the time of our evaluation. - Continue LR at 100 mL/h

## 2022-07-27 NOTE — Assessment & Plan Note (Addendum)
Unclear etiology, though the imaging obtained in the ED is certainly concerning for metastatic infection, unclear what the primary source of this infection could be, though lung would seem likely given his smoking history. There is impressive mucosal thickening of the stomach on imaging, so gastric cancer cannot be ruled out. Liver MRI revealed benign hemangiomas. Patient also has enlarged portacaval lymph nodes are suspicious for metastatic disease. Suspect ultimately he will need biopsy. Patient also shared that someone he lives with had similar symptoms over the past couple weeks. - GI consulted, appreciate recs - Pulm consulted, appreciate recs - May also consider IR consult to find the site most amenable to biopsy  - Hepatitis panel to assess for infectious process to rule out Hep A - Respiratory and Covid screen pending - Tylenol for fever - Compazine for nausea  - Toradol for pain

## 2022-07-27 NOTE — Progress Notes (Signed)
Spoke with ENT, Dr. Jearld Fenton regarding patient's parotid mass to discuss next steps to further evaluate. He recommended we get an US guided needle biopsy and then have the patient schedule an outpatient appointment to discuss further options.   Fortunato Curling, DO [facility], PGY-1 07/27/22 2:00 PM

## 2022-07-27 NOTE — Consult Note (Signed)
  Consultation  Referring Provider: Family medicine service/Dr. Neal Primary Care Physician:  Patient, [name] Primary Gastroenterologist:  none/ unassigned  Reason for Consultation: Abnormal CT abdomen and pelvis, recent sweats, question fever, decreased appetite  HPI: Robert Mccoy is a 52 y.o. male, generally in good health he does not have any known chronic medical problems and is not on any prescription medications, no PCP. Patient presented to the emergency room yesterday, not feeling well over the past week and a half.  He is complaining of intermittent sweats which made him think that he had a fever but did not have any documented fever, and no chills.  He had been having intermittent sweating during the daytime and at nighttime.  Appetite has been poor but no nausea or vomiting.  He put himself on soup and liquids.  He thought he may have had the flu, and had been exposed to a friend over Father's Day weekend who had just been diagnosed with a pneumonia. He has been having some intermittent coughing which has been nonproductive and complaining of aching pains in his body.  When asked about abdominal pain he says he is just had some aching across the upper abdomen and lower chest primarily with movement.  He had had some mild diarrhea with just 1-2 bowel movements per day, no melena or hematochezia.  He had been taking some Tylenol cold and flu and intermittent ibuprofen.  When symptoms were persisting he decided to come to the emergency room for evaluation. Labs yesterday showed WBC of 7.3/hemoglobin 11.6/platelets 334 Troponin 20 and 21 Lipase 155 Albumin 2.9 T. bili 0.9/alk phos 140/AST 46/ALT 60 PSA within normal limits HIV negative Hemoglobin A1c 5.6 COVID PCR pending Acute hepatitis panel pending  ET of the chest abdomen pelvis with contrast showed mixed solid and cystic nodules in the anterior right upper lobe 1 measuring 0.9 x 0.5 cm there are a couple of other  smaller lesions.  Cannot rule out primary lung malignancy There is some diffuse bilateral bronchial wall thickening. Hyperenhancing subcapsular lesion in the posterior liver dome 2.6 x 1.9 cm and 1 other smaller lesion, contracted gallbladder, no gallstones or wall thickening, normal-appearing pancreas and spleen, some mucosal thickening of the gastric antrum and pylorus appendix appears normal, no bowel wall distention or inflammatory changes sigmoid diverticulosis, enlarged portacaval nodes measuring up to 2.9 x 1.9 cm and enlarged bilateral inguinal lymph nodes measuring 2.7 x 1.4 cm.  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  He says he has been coughing more over the past couple of days.  Any prior abdominal surgery, no prior GI evaluation.  No known family history of GI issues. He does drink alcohol socially maybe once or twice per month, not on a daily basis.  No history of hepatitis. He mentions that while he was in prison in 2018 he had had problems with right facial swelling and was told that he had a gland in his facial area that may eventually need to be removed.     History reviewed. No pertinent past medical history.  History reviewed. No pertinent surgical history.  Prior to Admission   medications   Medication Sig Start Date End Date Taking? Authorizing Provider  acetaminophen (TYLENOL) 160 MG chewable tablet Chew 320 mg by mouth every 6 (six) hours as needed for pain.   Yes [provider]  ibuprofen (ADVIL) 200 MG tablet Take 600 mg by mouth every 4 (four) hours as needed for moderate pain.   Yes  [provider]    Current Facility-Administered Medications  Medication Dose Route Frequency Provider Last Rate Last Admin   acetaminophen (TYLENOL) tablet 650 mg  650 mg Oral Q6H PRN Sanford, James B, MD   650 mg at 07/27/22 0635   enoxaparin (LOVENOX) injection 40 mg  40 mg Subcutaneous Q24H Sanford, James B, MD   40 mg at 07/27/22 0939   famotidine (PEPCID) tablet 20 mg  20 mg Oral Daily PRN Dameron, Marisa, DO       ketorolac (TORADOL) 15 MG/ML injection 15 mg  15 mg Intravenous Q6H PRN Sanford, James B, MD   15 mg at 07/27/22 0939   lactated ringers infusion   Intravenous Continuous Sanford, James B, MD 100 mL/hr at 07/27/22 1127 Infusion Verify at 07/27/22 1127   prochlorperazine (COMPAZINE) injection 10 mg  10 mg Intravenous Q6H PRN Sanford, James B, MD        Allergies as of 07/26/2022   (No Known Allergies)    History reviewed. No pertinent family history.  Social History   Socioeconomic History   Marital status: Single    Spouse name: Not on file   Number of children: Not on file   Years of education: Not on file   Highest education level: Not on file  Occupational History   Not on file  Tobacco Use   Smoking status: Former   Smokeless tobacco: Former  Substance and Sexual Activity   Alcohol use: Yes    Comment: once a week/beers 16   Drug use: Not Currently   Sexual activity: Yes  Other Topics Concern   Not on file  Social History Narrative   Not on file   Social Determinants of Health   Financial Resource Strain: Not on file  Food Insecurity: Not on file  Transportation Needs: Not on file  Physical Activity: Not on file  Stress: Not on file  Social Connections: Not on file  Intimate Partner Violence: Not on file    Review of Systems: Pertinent positive and negative review of systems were noted in the above HPI section.  All other review of systems was otherwise negative.   Physical Exam: Vital signs in last 24 hours: Temp:  [98.8 F  (37.1 C)-100.3 F (37.9 C)] 99.2 F (37.3 C) (07/02 0818) Pulse Rate:  [62-98] 98 (07/02 0818) Resp:  [13-25] 17 (07/02 0818) BP: (106-135)/(72-85) 119/72 (07/02 0818) SpO2:  [93 %-100 %] 98 % (07/02 0818) Weight:  [96.7 kg] 96.7 kg (07/02 0945)   General:   Alert,  Well-developed, well-nourished, older AA male pleasant and cooperative in NAD Head:  Normocephalic and atraumatic. Eyes:  Sclera clear, no icterus.   Conjunctiva pink. Ears:  Normal auditory acuity. Nose:  No deformity, discharge,  or lesions. Mouth:  No deformity or lesions.   Neck:  Supple; no masses or thyromegaly. Lungs:  Clear throughout to auscultation.   No wheezes, crackles, or rhonchi.  Heart:  Regular rate and rhythm; no murmurs, clicks, rubs,  or gallops. Abdomen:  Soft,nontender, BS active,nonpalp mass or hsm.   Rectal:  not done Msk:  Symmetrical without gross deformities. . Pulses:    Normal pulses noted. Extremities:  Without clubbing or edema. Neurologic:  Alert and  oriented x4;  grossly normal neurologically. Skin:  Intact without significant lesions or rashes.. Psych:  Alert and cooperative. Normal mood and affect.  Intake/Output from previous day: 07/01 0701 - 07/02 0700 In: 1000 [IV Piggyback:1000] Out: -  Intake/Output this shift: Total I/O In: 1492.4 [P.O.:360; I.V.:1132.4] Out: -   Lab Results: Recent Labs    07/26/22 1134 07/27/22 0018  WBC 7.3 5.8  HGB 11.6* 11.4*  HCT 38.0* 35.9*  PLT 334 357   BMET Recent Labs    07/26/22 1134 07/27/22 0018  NA 135 134*  K 4.5 3.9  CL 100 96*  CO2 17* 24  GLUCOSE 223* 189*  BUN 7 11  CREATININE 0.80 1.00  CALCIUM 9.3 9.1   LFT Recent Labs    07/26/22 1510 07/27/22 0018  PROT 7.4 6.9  ALBUMIN 2.9* 2.7*  AST 46* 76*  ALT 60* 78*  ALKPHOS 140* 148*  BILITOT 0.9 0.8  BILIDIR 0.3*  --   IBILI 0.6  --    PT/INR No results for input(s): "LABPROT", "INR" in the last 72 hours. Hepatitis Panel No results for input(s): "HEPBSAG",  "HCVAB", "HEPAIGM", "HEPBIGM" in the last 72 hours.   IMPRESSION:  #1  52-year-old African-American male with 1 and half to 2-week history of illness with intermittent sweats, decrease in appetite, body aches mild diarrhea which has resolved and had complained of aching in his upper abdomen lower chest with movement on presentation yesterday.  Patient has previously been healthy with no known chronic medical problems.  CT imaging with multiple findings including some mixed small less than 1 cm solid and cystic nodules in the right upper lobe of the lung, there were hyperenhancing lesions in the dome of the liver, and some thickening of the gastric antrum and pylorus, enlarged portacaval nodes and enlarged inguinal nodes.  MRI of the liver shows the hyperintense liver lesions to be consistent with benign hemangiomas  Etiology of the nonspecific adenopathy is not clear, this could be infectious versus malignant  Covid  screen pending  Nonspecific antral and pyloric thickening-rule out gastroduodenitis, he has been taking some aspirin and NSAIDs recently  #2 mildly elevated LFTs chronicity not clear-acute hepatitis serologies pending  #3 mild normocytic anemia #4.  Mild hypoalbuminemia  PLAN: Diet as tolerated Once daily PPI Will discuss with Dr. Mansouraty consider EGD tomorrow morning to further evaluate stomach given thickening on CT and portacaval nodes. Will need further evaluation of the lung nodules with repeat imaging in 3 months.  GI will follow with you     Bruk Tumolo EsterwoodPA-C  07/27/2022, 1:23 PM    

## 2022-07-27 NOTE — Progress Notes (Addendum)
Daily Progress Note Intern Pager: [phone]  Patient name: Robert Mccoy Medical record number: [id number] Date of birth: 09/20/1970 Age: 52 y.o. Gender: male  Primary Care Provider: Patient, [name] Consultants: GI Code Status: Full Code  Pt Overview and Major Events to Date:  07/01: Admitted  Assessment and Plan: Robert Mccoy is a 52 y.o. male who presented to the ED with abdominal pain, fever, night sweats, and decreased appetite for the past two weeks with concern for malignancy due to CT abdomen/pelvis, primary source of malignancy remains uncertain.   Hospital Problem List      Hospital     * (Principal) Abdominal pain     Unclear etiology, though the imaging obtained in the ED is certainly  concerning for metastatic infection, unclear what the primary source of  this infection could be, though lung would seem likely given his smoking  history. There is impressive mucosal thickening of the stomach on imaging,  so gastric cancer cannot be ruled out. Liver MRI revealed benign  hemangiomas. Patient also has enlarged portacaval lymph nodes are  suspicious for metastatic disease. Suspect ultimately he will need biopsy.  Patient also shared that someone he lives with had similar symptoms over  the past couple weeks. - GI consulted, appreciate recs - Pulm consulted, appreciate recs - May also consider IR consult to find the site most amenable to biopsy  - Hepatitis panel to assess for infectious process to rule out Hep A - Respiratory and Covid screen pending - Tylenol for fever - Compazine for nausea  - Toradol for pain        Hepatic lesion     MRI Liver revealed benign hemangiomas and peripancreatic and portacaval  lymph nodes        Dehydration     Likely in the setting of poor PO. intake in the setting of longstanding  abdominal pain as above.  Anion gap elevated 18 on admission and improved  to 14 today. Fluid status improved at the time  of our evaluation. - Continue LR at 100 mL/h        Abnormal EKG     Serial EKGs were obtained in the ED due to diffuse STE (though minimal)  and diaphoresis. He denies any CP and Trops trended flat 20>21. Not  especially concerned for pericarditis given no CP.  - Will repeat EKG again to monitor for any dynamic changes - Repeat Trop/EKG if develops CP  - Will check one more troponin         Pulmonary nodule     Denies cough or SOB, but has a smoking history. In order to rule out  the primary source of malignancy being form the lungs, a pulmonary consult  may be beneficial. 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 Does not appear to be obstructing airway and no adenopathy  noted. Per patient history has been there for many years, but has grown  larger. - ENT consulted, appreciate recs      FEN/GI: NPO PPx: Lovenox Dispo: Med-Surg  Subjective:  Overnight, the patient complained of having fever and minimal night sweats.  He was given Tylenol for his fever and Toradol for pain.  Patient is doing well today and ate his breakfast  this morning.  No other complaints today.  Objective: Temp:  [98.8 F (37.1 C)-100.3 F (37.9 C)] 99.2 F (37.3 C) (07/02 0818) Pulse Rate:  [62-98] 98 (07/02 0818) Resp:  [13-20] 17 (07/02 0818) BP: (106-134)/(72-80) 119/72 (07/02 0818) SpO2:  [93 %-100 %] 98 % (07/02 0818) Weight:  [96.7 kg] 96.7 kg (07/02 0945)  Physical Exam: General: NAD, awake and alert, comfortable appearing HEENT: 5cm mass appreciated over the R parotid gland Cardiovascular: RRR. No M/R/G Respiratory: CTAB, normal WOB on RA Abdomen: Mild RUE and epigastric TTP, minimally distended w/o rebound or guarding Extremities: no BLE edema Neuro: No focal neurological deficits.   Laboratory: Most recent CBC Lab Results   Component Value Date   WBC 5.8 07/27/2022   HGB 11.4 (L) 07/27/2022   HCT 35.9 (L) 07/27/2022   MCV 92.1 07/27/2022   PLT 357 07/27/2022   Most recent BMP    Latest Ref Rng & Units 07/27/2022   12:18 AM  BMP  Glucose 70 - 99 mg/dL 161   BUN 6 - 20 mg/dL 11   Creatinine 0.96 - 1.24 mg/dL 0.45   Sodium 409 - 811 mmol/L 134   Potassium 3.5 - 5.1 mmol/L 3.9   Chloride 98 - 111 mmol/L 96   CO2 22 - 32 mmol/L 24   Calcium 8.9 - 10.3 mg/dL 9.1     Imaging/Diagnostic Tests: MRI Liver 1. No suspicious liver lesions identified. 2. Lesions identified on the recent CT from 07/26/2022 correspond to benign liver hemangiomas. No follow-up imaging recommended. 3. Again seen are enlarged peripancreatic and portacaval lymph nodes. Findings are nonspecific as mentioned on 07/26/2022. Differential considerations include reactive change versus nodal metastasis. 4. Trace bilateral pleural fluid.   Fortunato Curling, DO 07/27/2022, 1:48 PM PGY-1, [facility] Family Medicine  FPTS Intern pager: [phone], text pages welcome Secure chat group [facility] Teaching Service

## 2022-07-28 ENCOUNTER — Inpatient Hospital Stay (HOSPITAL_COMMUNITY): Payer: No Typology Code available for payment source | Admitting: Anesthesiology

## 2022-07-28 ENCOUNTER — Other Ambulatory Visit (HOSPITAL_COMMUNITY): Payer: Self-pay

## 2022-07-28 ENCOUNTER — Telehealth: Payer: Self-pay | Admitting: Internal Medicine

## 2022-07-28 ENCOUNTER — Encounter (HOSPITAL_COMMUNITY)
Admission: EM | Disposition: A | Discharge status: Home / Self Care | Payer: Self-pay | Source: Home / Self Care | Attending: Family Medicine

## 2022-07-28 DIAGNOSIS — K2980 Duodenitis without bleeding: Secondary | ICD-10-CM | POA: Diagnosis not present

## 2022-07-28 DIAGNOSIS — K449 Diaphragmatic hernia without obstruction or gangrene: Secondary | ICD-10-CM

## 2022-07-28 DIAGNOSIS — K2970 Gastritis, unspecified, without bleeding: Secondary | ICD-10-CM | POA: Diagnosis not present

## 2022-07-28 DIAGNOSIS — K2289 Other specified disease of esophagus: Secondary | ICD-10-CM | POA: Diagnosis not present

## 2022-07-28 DIAGNOSIS — K259 Gastric ulcer, unspecified as acute or chronic, without hemorrhage or perforation: Secondary | ICD-10-CM

## 2022-07-28 DIAGNOSIS — R911 Solitary pulmonary nodule: Secondary | ICD-10-CM

## 2022-07-28 DIAGNOSIS — K2900 Acute gastritis without bleeding: Secondary | ICD-10-CM

## 2022-07-28 DIAGNOSIS — K2990 Gastroduodenitis, unspecified, without bleeding: Secondary | ICD-10-CM | POA: Diagnosis not present

## 2022-07-28 DIAGNOSIS — K769 Liver disease, unspecified: Secondary | ICD-10-CM | POA: Diagnosis not present

## 2022-07-28 HISTORY — PX: ESOPHAGOGASTRODUODENOSCOPY (EGD) WITH PROPOFOL: SHX5813

## 2022-07-28 HISTORY — PX: BIOPSY: SHX5522

## 2022-07-28 LAB — URINALYSIS, ROUTINE W REFLEX MICROSCOPIC
Bacteria, UA: NONE SEEN
Bilirubin Urine: NEGATIVE
Glucose, UA: >=500 mg/dL — AB
Hgb urine dipstick: NEGATIVE
Ketones, ur: NEGATIVE mg/dL
Leukocytes,Ua: NEGATIVE
Nitrite: NEGATIVE
Protein, ur: NEGATIVE mg/dL
Specific Gravity, Urine: 1.003 — ABNORMAL LOW (ref 1.005–1.030)
pH: 7 (ref 5.0–8.0)

## 2022-07-28 LAB — COMPREHENSIVE METABOLIC PANEL
ALT: 99 U/L — ABNORMAL HIGH (ref 0–44)
AST: 81 U/L — ABNORMAL HIGH (ref 15–41)
Albumin: 2.5 g/dL — ABNORMAL LOW (ref 3.5–5.0)
Alkaline Phosphatase: 148 U/L — ABNORMAL HIGH (ref 38–126)
Anion gap: 8 (ref 5–15)
BUN: 5 mg/dL — ABNORMAL LOW (ref 6–20)
CO2: 25 mmol/L (ref 22–32)
Calcium: 8.9 mg/dL (ref 8.9–10.3)
Chloride: 101 mmol/L (ref 98–111)
Creatinine, Ser: 0.86 mg/dL (ref 0.61–1.24)
GFR, Estimated: >60 mL/min (ref >60)
Glucose, Bld: 300 mg/dL — ABNORMAL HIGH (ref 70–99)
Potassium: 3.8 mmol/L (ref 3.5–5.1)
Sodium: 134 mmol/L — ABNORMAL LOW (ref 135–145)
Total Bilirubin: 0.7 mg/dL (ref 0.3–1.2)
Total Protein: 6.4 g/dL — ABNORMAL LOW (ref 6.5–8.1)

## 2022-07-28 LAB — RPR
RPR Ser Ql: REACTIVE — AB
RPR Titer: 1:16 {titer}

## 2022-07-28 SURGERY — ESOPHAGOGASTRODUODENOSCOPY (EGD) WITH PROPOFOL
Anesthesia: Monitor Anesthesia Care

## 2022-07-28 MED ORDER — LIDOCAINE 2% (20 MG/ML) 5 ML SYRINGE
INTRAMUSCULAR | Status: DC | PRN
  Administered 2022-07-28: 80 mg via INTRAVENOUS

## 2022-07-28 MED ORDER — SUCRALFATE 1 G PO TABS
1.0000 g | ORAL_TABLET | Freq: Two times a day (BID) | ORAL | 0 refills | Status: AC
  Filled 2022-07-28: qty 60, 30d supply, fill #0

## 2022-07-28 MED ORDER — PENICILLIN G BENZATHINE 1200000 UNIT/2ML IM SUSY
2.4000 10*6.[IU] | PREFILLED_SYRINGE | Freq: Once | INTRAMUSCULAR | Status: AC
  Administered 2022-07-28: 2.4 10*6.[IU] via INTRAMUSCULAR
  Filled 2022-07-28: qty 4

## 2022-07-28 MED ORDER — PANTOPRAZOLE SODIUM 40 MG PO TBEC
40.0000 mg | DELAYED_RELEASE_TABLET | Freq: Two times a day (BID) | ORAL | 0 refills | Status: AC
  Filled 2022-07-28: qty 60, 30d supply, fill #0

## 2022-07-28 MED ORDER — PROPOFOL 10 MG/ML IV BOLUS
INTRAVENOUS | Status: DC | PRN
  Administered 2022-07-28 (×5): 80 mg via INTRAVENOUS

## 2022-07-28 MED ORDER — MUPIROCIN 2 % EX OINT
TOPICAL_OINTMENT | Freq: Two times a day (BID) | CUTANEOUS | Status: DC
  Filled 2022-07-28: qty 22

## 2022-07-28 MED ORDER — MUPIROCIN 2 % EX OINT
TOPICAL_OINTMENT | Freq: Two times a day (BID) | CUTANEOUS | 0 refills | Status: AC
  Filled 2022-07-28: qty 22, 14d supply, fill #0

## 2022-07-28 MED ORDER — SUCRALFATE 1 G PO TABS
1.0000 g | ORAL_TABLET | Freq: Two times a day (BID) | ORAL | Status: DC
  Administered 2022-07-28: 1 g via ORAL
  Filled 2022-07-28: qty 1

## 2022-07-28 MED ORDER — LACTATED RINGERS IV SOLN: INTRAVENOUS | Status: DC

## 2022-07-28 MED ORDER — PANTOPRAZOLE SODIUM 40 MG PO TBEC: 40.0000 mg | DELAYED_RELEASE_TABLET | Freq: Two times a day (BID) | ORAL | Status: DC

## 2022-07-28 SURGICAL SUPPLY — 15 items

## 2022-07-28 NOTE — Progress Notes (Signed)
Discharge meds delivered to pt room  

## 2022-07-28 NOTE — Anesthesia Preprocedure Evaluation (Addendum)
Anesthesia Evaluation  Patient identified by MRN, date of birth, ID band Patient awake    Reviewed: Allergy & Precautions, NPO status , Patient's Chart, lab work & pertinent test results  History of Anesthesia Complications Negative for: history of anesthetic complications  Airway Mallampati: I  TM Distance: >3 FB Neck ROM: Full    Dental  (+) Dental Advisory Given, Chipped   Pulmonary Current Smoker and Patient abstained from smoking.   Pulmonary exam normal        Cardiovascular negative cardio ROS Normal cardiovascular exam     Neuro/Psych negative neurological ROS  negative psych ROS   GI/Hepatic  Hepatic lesion   N/V, abdominal pain - no vomiting x 2 days, nausea last yesterday     Endo/Other   Parotid mass   Renal/GU negative Renal ROS     Musculoskeletal negative musculoskeletal ROS (+)    Abdominal   Peds  Hematology  (+) Blood dyscrasia, anemia   Anesthesia Other Findings   Reproductive/Obstetrics                             Anesthesia Physical Anesthesia Plan  ASA: 2  Anesthesia Plan: MAC   Post-op Pain Management: Minimal or no pain anticipated   Induction:   PONV Risk Score and Plan: 0 and Treatment may vary due to age or medical condition and Propofol infusion  Airway Management Planned: Nasal Cannula and Natural Airway  Additional Equipment: None  Intra-op Plan:   Post-operative Plan:   Informed Consent: I have reviewed the patients History and Physical, chart, labs and discussed the procedure including the risks, benefits and alternatives for the proposed anesthesia with the patient or authorized representative who has indicated his/her understanding and acceptance.       Plan Discussed with: CRNA and Anesthesiologist  Anesthesia Plan Comments:        Anesthesia Quick Evaluation

## 2022-07-28 NOTE — Telephone Encounter (Signed)
Needs follow up scan in 3 months and follow up with APP

## 2022-07-28 NOTE — Assessment & Plan Note (Addendum)
Serial EKGs were obtained in the ED due to diffuse STE (though minimal) and diaphoresis. He denies any CP and Trops trended flat 20>21>26>24. Not especially concerned for pericarditis given no CP.  - Repeat Trop/EKG if develops CP  - Will check one more troponin

## 2022-07-28 NOTE — Discharge Instructions (Addendum)
Dear [name],  Thank you for letting us participate in your care. You were hospitalized for abdominal pain and concerning CT findings and diagnosed with Abdominal pain. You had an EGD and MRI to follow up on the CT results. You were seen by a pulmonologist and gastroenterologist in the hospital as well. We treated you with medications to subside your abdominal pain and keep you comfortable.   POST-HOSPITAL & CARE INSTRUCTIONS Go to your follow up appointments (listed below). If you need to change your appointment or cancel, please call us at [phone], thank you.   DOCTOR'S APPOINTMENT   Future Appointments  Date Time Provider Department Center  08/06/2022  3:15 PM Fortunato Curling, DO [facility]    Follow-up Information     Fortunato Curling, DO Follow up.   Why: You have an appointment with Dr. Fatima Blank at 3:15 pm on 08/06/22. Please show up 15 minutes in advance, thank you. Contact information: [address] [place] [address] [phone]                 Take care and be well!  Family Medicine Teaching Service Inpatient Team [place]  [facility]  [address] [place], [place] [address] [phone]

## 2022-07-28 NOTE — Anesthesia Postprocedure Evaluation (Signed)
Anesthesia Post Note  Patient: Robert Mccoy  Procedure(s) Performed: ESOPHAGOGASTRODUODENOSCOPY (EGD) WITH PROPOFOL BIOPSY     Patient location during evaluation: PACU Anesthesia Type: MAC Level of consciousness: awake and alert Pain management: pain level controlled Vital Signs Assessment: post-procedure vital signs reviewed and stable Respiratory status: spontaneous breathing, nonlabored ventilation and respiratory function stable Cardiovascular status: stable and blood pressure returned to baseline Anesthetic complications: no   No notable events documented.  Last Vitals:  Vitals:   07/28/22 0940 07/28/22 0945  BP: 138/85 131/78  Pulse: 76 84  Resp: 18 (!) 23  Temp:    SpO2: 97% 93%    Last Pain:  Vitals:   07/28/22 0956  TempSrc:   PainSc: 0-No pain                 Beryle Lathe

## 2022-07-28 NOTE — Op Note (Signed)
[facility] Patient Name: Robert Mccoy Procedure Date : 07/28/2022 MRN: [id number] Attending MD: Corliss Parish , MD, [id number] Date of Birth: 09-May-1970 CSN: [id number] Age: 52 Admit Type: Inpatient Procedure:                Upper GI endoscopy Indications:              Upper abdominal pain, Abnormal CT of the GI tract Providers:                Corliss Parish, MD, Stephens Shire RN, RN, Eliberto Ivory, RN, Leanne Lovely, Technician Referring MD:             Inpatient medical service Medicines:                Monitored Anesthesia Care Complications:            No immediate complications. Estimated Blood Loss:     Estimated blood loss was minimal. Procedure:                Pre-Anesthesia Assessment:                           - Prior to the procedure, a History and Physical                            was performed, and patient medications and                            allergies were reviewed. The patient's tolerance of                            previous anesthesia was also reviewed. The risks                            and benefits of the procedure and the sedation                            options and risks were discussed with the patient.                            All questions were answered, and informed consent                            was obtained. Prior Anticoagulants: The patient has                            taken no anticoagulant or antiplatelet agents                            except for NSAID medication. ASA Grade Assessment:                            II - A patient with mild systemic disease. After  reviewing the risks and benefits, the patient was                            deemed in satisfactory condition to undergo the                            procedure.                           After obtaining informed consent, the endoscope was                            passed under direct  vision. Throughout the                            procedure, the patient's blood pressure, pulse, and                            oxygen saturations were monitored continuously. The                            [id number]-H190 ([id number]) Olympus endoscope was introduced                            through the mouth, and advanced to the second part                            of duodenum. The upper GI endoscopy was                            accomplished without difficulty. The patient                            tolerated the procedure. Scope In: Scope Out: Findings:      No gross lesions were noted in the entire esophagus.      The Z-line was irregular and was found 43 cm from the incisors.      A 2 cm hiatal hernia was present.      One non-bleeding cratered gastric ulcer with a clean ulcer base (Forrest       Class III) was found in the gastric antrum. The lesion was 14 mm in       largest dimension.      Segmental moderate inflammation characterized by erosions, erythema,       friability and shallow linear ulcerations were found in the entire       examined stomach. Biopsies were taken with a cold forceps for histology       and Helicobacter pylori testing.      Patchy moderate inflammation characterized by congestion (edema) and       erythema was found in the duodenal bulb, in the first portion of the       duodenum and in the second portion of the duodenum. Biopsies were taken       with a cold forceps for histology. Impression:               - No gross lesions in the entire  esophagus.                           - Z-line irregular, 43 cm from the incisors.                           - 2 cm hiatal hernia.                           - Non-bleeding gastric ulcer with a clean ulcer                            base (Forrest Class III).                           - Gastritis. Biopsied.                           - Duodenitis. Biopsied. Recommendation:           - The patient will be observed  post-procedure,                            until all discharge criteria are met.                           - Return patient to hospital ward for ongoing care.                           - Advance diet as tolerated.                           - Increase PPI to 40 mg twice daily.                           - Start Carafate 1 g twice daily for next 1 to 2                            months.                           - Observe patient's clinical course.                           - Await pathology results.                           - Recommend repeat EGD in 3 to 4 months to ensure                            healing of gastritis/gastric ulcer/duodenitis.                           - If patient wants to follow-up with [place], then we                            can  also do his screening colonoscopy at the time                            of his endoscopy. Procedure Code(s):        --- Professional ---                           [phone], Esophagogastroduodenoscopy, flexible,                            transoral; with biopsy, single or multiple Diagnosis Code(s):        --- Professional ---                           K22.89, Other specified disease of esophagus                           K44.9, Diaphragmatic hernia without obstruction or                            gangrene                           K25.9, Gastric ulcer, unspecified as acute or                            chronic, without hemorrhage or perforation                           K29.70, Gastritis, unspecified, without bleeding                           K29.80, Duodenitis without bleeding                           R10.10, Upper abdominal pain, unspecified                           R93.3, Abnormal findings on diagnostic imaging of                            other parts of digestive tract CPT copyright 2022 American [employer] Association. All rights reserved. The codes documented in this report are preliminary and upon coder review may  be revised to meet  current compliance requirements. Corliss Parish, MD 07/28/2022 9:27:39 AM Number of Addenda: 0

## 2022-07-28 NOTE — Assessment & Plan Note (Addendum)
Denies cough or SOB, but has a smoking history. In order to rule out the primary source of malignancy being form the lungs, a pulmonary consult may be beneficial. - Pulm consulted and advised that a repeat CT chest can be done in 3 months.

## 2022-07-28 NOTE — Progress Notes (Signed)
Discharge instructions given to pt. Pt verbalized understanding of all teaching and had no further questions. 

## 2022-07-28 NOTE — Progress Notes (Signed)
Pt arrived back to 6 north room 6 from EGD alert and oriented x4. Pain level 0/10. Bed in lowest position. Call light in reach. Full liquid tray ordered. Will continue to monitor pt.

## 2022-07-28 NOTE — Assessment & Plan Note (Addendum)
Upon admission patient's CT abd/pelvis demonstrated a concern for metastatic disease, unclear what the primary source is at this time. Workup being done by GI with an EGD to further investigated the mucosal thickening. His liver MRI revealed benign hemangiomas. Suspect ultimately he will need biopsy. Patient also shared that someone he lives with had similar symptoms over the past couple weeks. Hepatitis and respiratory panel were performed to rule out infectious process, both negative.  - GI consulted, appreciate recs - Pulm consulted, appreciate recs - GI recommended Protonix 40 mg and Carafate 1g - May also consider IR consult to find the site most amenable to biopsy  - Tylenol for fever - Compazine for nausea  - Toradol for pain

## 2022-07-28 NOTE — Interval H&P Note (Signed)
History and Physical Interval Note:  07/28/2022 8:50 AM  Robert Mccoy  has presented today for surgery, with the diagnosis of epigastric pain,adenopathy on CT.  The various methods of treatment have been discussed with the patient and family. After consideration of risks, benefits and other options for treatment, the patient has consented to  Procedure(s): ESOPHAGOGASTRODUODENOSCOPY (EGD) WITH PROPOFOL (N/A) as a surgical intervention.  The patient's history has been reviewed, patient examined, no change in status, stable for surgery.  I have reviewed the patient's chart and labs.  Questions were answered to the patient's satisfaction.     [employer]

## 2022-07-28 NOTE — Discharge Summary (Addendum)
Family Medicine Teaching [facility] Discharge Summary  Patient name: Robert Mccoy Medical record number: [id number] Date of birth: [birth year] Age: 52 y.o. Gender: male Date of Admission: 07/26/2022  Date of Discharge: 07/28/22  Admitting Physician: Alicia Amel, MD  Primary Care Provider: Patient, [name] Consultants: GI, Pulm, ENT  Indication for Hospitalization: Abdominal Pain  Discharge Diagnoses/Problem List:  Principal Problem for Admission: Abdominal pain Other Problems addressed during stay:  Principal Problem:   Abdominal pain Active Problems:   Dehydration   Abnormal EKG   Solitary pulmonary nodule   Parotid mass   Lymphadenopathy    Brief Hospital Course:  Robert Mccoy is a 52 y.o.male with no known medical history who was admitted to the [facility] Medicine Teaching Service at [facility] for experiencing 2 weeks of B with concerning findings for malignancy found on his ED CT abdomen/pelvis. His hospital course is detailed below:  Abdominal Pain Patient presented with abdominal pain and night sweats for 2 weeks. In the ED, patient was found to have evidence of dehydration with an anion gap to 18, he was given fluids.  CT with multiple findings concerning for metastatic disease including lung nodules, gastric wall thickening, and inguinal lymphadenopathy. For this reason, family medicine service was consulted to admit. There were also liver lesions noted on CT which were followed up with an MRI that revealed benign hemangiomas.  GI was consulted due to abdominal pain and gastric wall thickening and performed an EGD which revealed only a non-bleeding ulcer in the gastric antrum and moderate inflammation throughout the stomach. Biopsies were collected. He was treated with Protonix, Carafate, and Toradol for pain.  We discussed the pulmonary findings with pulmonology who did not feel that his lesions were likely to represent primary or metastatic  malignancy and recommended only outpatient follow-up and smoking cessation.  Ultimately his symptoms improved as he approached discharge and he was discharged home with outpatient follow-up of his parotid mass and inguinal lymphadenopathy as discussed below.  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 It has grown to 4.3 cm from 2.0 before. We connected with Dr. Jearld Fenton (ENT) he recommended outpatient follow-up for a potential ultrasound-guided needle biopsy. Given the patient's difficulty with outpatient follow-up in the past, we offered inpatient biopsy, but he preferred to follow this outpatient with Dr. Jearld Fenton. He attributed his past poor follow-up to being uninsured. He does have insurance now.   Inguinal Lymphadenopathy  Syphilis  GU exam performed due to inguinal lymphadenopathy. On exam there was 2 cm keratotic, thickened, nontender penile tissue present on the dorsal aspect of his penile shaft. No ulceration.  RPR was collected which resulted positive with a 1:16 titer. Though treponemal verification hadn't resulted by the time of discharge, given high titers we elected to initiate therapy with 2.4 million units of Penicillin G prior to discharge.   PCP Follow-up Recommendations:  GI recommended repeat EGD in 3-4 months and outpatient colonoscopy for colon cancer screening. Please ensure he has follow-up with  GI.  Follow-up EGD biopsies  Repeat CT Chest in 3 months to reassess RUL nodule  Outpatient follow up with Dr. Jearld Fenton for US guided needle biopsy for parotid mass. Will need an order for ENT referral.  Given likely syphilis infection, urine  was collected to test for GC/Chlamydia and trichomonas prior to discharge. Please follow-up on these results and ensure that he was treated  if not.  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 That said, given relatively high titers, could consider two more doses spread a week apart.   Disposition: Home  Discharge Condition: Stable  Discharge Exam:  General: NAD, awake and alert, comfortable appearing HEENT: 4-5 cm mass appreciated over the R parotid gland Cardiovascular: RRR. No M/R/G Respiratory: CTAB, normal WOB on RA Abdomen: Mild RUE and epigastric TTP, minimally distended w/o rebound or guarding  Extremities: no BLE edema Neuro: No focal neurological deficits Genitourinary: There is palpable inguinal lymphadenopathy. There is a 2 cm hyperkeratotic, thickened, nontender penile tissue. No ulceration.  DRE: multiple anal tags, no hemorrhoids appreciated, no mass appreciated    Vitals:   07/28/22 0940 07/28/22 0945  BP: 138/85 131/78  Pulse: 76 84  Resp: 18 (!) 23  Temp:    SpO2: 97% 93%    Significant Procedures: EGD - 07/28/22 - No gross lesions in the entire esophagus. - Z-line irregular, 43 cm from the incisors. - 2 cm hiatal hernia. Non-bleeding gastric ulcer with a clean ulcer base (Forrest Class III). - Gastritis. Biopsied. - Duodenitis. Biopsied.  Significant Labs and Imaging:  Recent Labs  Lab 07/27/22 0018  WBC 5.8  HGB 11.4*  HCT 35.9*  PLT 357   Recent Labs  Lab 07/26/22 1510 07/27/22 0018 07/28/22 0201  NA  --  134* 134*  K  --  3.9 3.8  CL  --  96* 101  CO2  --  24 25  GLUCOSE  --  189* 300*  BUN  --  11 5*  CREATININE  --  1.00 0.86  CALCIUM  --  9.1 8.9  ALKPHOS 140* 148* 148*  AST 46* 76* 81*  ALT 60* 78* 99*  ALBUMIN 2.9* 2.7* 2.5*    CT Chest, Abdomen, Pelvis w/ contrast 1. Mucosal thickening of the gastric antrum and pylorus, suggestive of nonspecific infectious or inflammatory gastritis. 2. Diverticulosis without acute diverticulitis. 3. Mixed solid and  cystic pulmonary nodule of the anterior right upper lobe measuring 0.9 x 0.9 cm, solid component of the left aspect measuring 0.6 cm. Although possibly infectious or inflammatory, this nodule is modestly concerning for primary lung malignancy. Additional small solid nodule just posteriorly measuring 0.6 cm. Follow-up non-contrast CT recommended at 3-6 months to confirm persistence. If unchanged, and solid component remains <6 mm, annual CT is recommended until 5 years of stability has been established. If persistent these nodules should be considered highly suspicious if the solid component of the nodule is 6 mm or greater in size and enlarging. This recommendation follows the consensus statement: Guidelines for Management of Incidental Pulmonary Nodules Detected on CT Images: From the Fleischner Society 2017; Radiology 2017; 284:228-243. 4. Hyperenhancing subcapsular lesion of the posterior liver dome measuring 2.6 x 1.9 cm. Additional ill-defined hypodense lesion of the anterior left lobe of the liver, hepatic segment IVA, measuring 1.7 x 1.6 cm. Although possibly benign, and particularly the posterior lesion possibly a flash filling hemangioma, these are incompletely characterized on this single phase contrast enhanced examination. Recommend multiphasic contrast enhanced MRI to further evaluate. 5. Enlarged portacaval lymph nodes measuring up to 2.9 x 1.7 cm. Enlarged bilateral inguinal lymph nodes measuring up to 2.7 x 1.4 cm. These are nonspecific  and may be reactive, however are suspicious for metastatic disease. 6. Cardiomegaly. 7. Minimal emphysema and diffuse bilateral bronchial wall thickening.  MRI Liver 1. No suspicious liver lesions identified. 2. Lesions identified on the recent CT from 07/26/2022 correspond to benign liver hemangiomas. No follow-up imaging recommended. 3. Again seen are enlarged peripancreatic and portacaval lymph nodes. Findings are nonspecific as  mentioned on 07/26/2022. Differential considerations include reactive change versus nodal metastasis. 4. Trace bilateral pleural fluid.  CT Temporal Bones w/ contrast  Right parotid mass without temporal bone invasion. No evidence of middle or inner ear disease.  CT Maxillofacial w/ contrast 4.3 cm right parotid mass/neoplasm. No features to implicate a specific histology. No adenopathy in the covered neck.   Results/Tests Pending at Time of Discharge: RPR  Discharge Medications:  Allergies as of 07/28/2022   No Known Allergies      Medication List     STOP taking these medications    acetaminophen 160 MG chewable tablet Commonly known as: TYLENOL   ibuprofen 200 MG tablet Commonly known as: ADVIL       TAKE these medications    mupirocin ointment 2 % Commonly known as: BACTROBAN Apply topically 2 (two) times daily.   pantoprazole 40 MG tablet Commonly known as: PROTONIX Take 1 tablet (40 mg total) by mouth 2 (two) times daily before a meal.   sucralfate 1 g tablet Commonly known as: CARAFATE Take 1 tablet (1 g total) by mouth 2 (two) times daily.        Discharge Instructions: Please refer to Patient Instructions section of EMR for full details.  Patient was counseled important signs and symptoms that should prompt return to medical care, changes in medications, dietary instructions, activity restrictions, and follow up appointments.   Follow-Up Appointments:  Follow-up Information     Fortunato Curling, DO Follow up.   Why: You have an appointment with Dr. Fatima Blank at 3:15 pm on 08/06/22. Please show up 15 minutes in advance, thank you. Contact information: [address] [place] [address] [phone]                 Fortunato Curling, DO 07/28/2022, 12:25 PM PGY-1, [facility]     I have evaluated this patient along with Dr. Fatima Blank and reviewed the above note, making necessary revisions.  Dorothyann Gibbs, MD 07/28/2022, 12:31  PM PGY-2, [facility]

## 2022-07-28 NOTE — Assessment & Plan Note (Addendum)
Likely in the setting of poor PO. intake in the setting of longstanding abdominal pain as above.  Anion gap elevated 18 on admission and improved to 8 today. Fluid status improved at the time of our evaluation.  - Oral hydration

## 2022-07-28 NOTE — TOC Transition Note (Signed)
Transition of Care [facility]) - CM/SW Discharge Note   Patient Details  Name: Robert Mccoy MRN: [id number] Date of Birth: Sep 20, 1970  Transition of Care [facility]) CM/SW Contact:  Kermit Balo, RN Phone Number: 07/28/2022, 1:48 PM   Clinical Narrative:    Pt is discharging home with self care. [facility] is picking him up in their clinic for PCP.  Pt has transportation home.   Final next level of care: Home/Self Care Barriers to Discharge: No Barriers Identified   Patient Goals and CMS Choice      Discharge Placement                         Discharge Plan and Services Additional resources added to the After Visit Summary for                                       Social Determinants of Health (SDOH) Interventions SDOH Screenings   Tobacco Use: Medium Risk (07/26/2022)     Readmission Risk Interventions     No data to display

## 2022-07-28 NOTE — Transfer of Care (Signed)
Immediate Anesthesia Transfer of Care Note  Patient: Robert Mccoy  Procedure(s) Performed: ESOPHAGOGASTRODUODENOSCOPY (EGD) WITH PROPOFOL BIOPSY  Patient Location: PACU and Endoscopy Unit  Anesthesia Type:MAC  Level of Consciousness: awake and oriented  Airway & Oxygen Therapy: Patient Spontanous Breathing  Post-op Assessment: Report given to RN and Post -op Vital signs reviewed and stable  Post vital signs: Reviewed and stable  Last Vitals:  Vitals Value Taken Time  BP 121/79 07/28/22 0926  Temp 36.4 C 07/28/22 0925  Pulse 86 07/28/22 0928  Resp 27 07/28/22 0928  SpO2 97 % 07/28/22 0928  Vitals shown include unvalidated device data.  Last Pain:  Vitals:   07/28/22 0925  TempSrc: Temporal  PainSc: 0-No pain      Patients Stated Pain Goal: 0 (07/27/22 1558)  Complications: No notable events documented.

## 2022-07-29 ENCOUNTER — Telehealth: Payer: Self-pay | Admitting: Family Medicine

## 2022-07-29 ENCOUNTER — Encounter (HOSPITAL_COMMUNITY): Payer: Self-pay | Admitting: Gastroenterology

## 2022-07-29 LAB — HEPATITIS B SURFACE ANTIBODY, QUANTITATIVE: Hep B S AB Quant (Post): <3.5 m[IU]/mL — ABNORMAL LOW

## 2022-07-29 NOTE — Telephone Encounter (Signed)
I called to check on the patient post-hospital discharge and discuss lab results with him. His Hepatitis B surface Ab shows non-immune. He'll need to begin the Hep B series. I also discussed his hyperglycemia--his serum glucose was elevated, and his urine glucose was> 500 despite his normal A1C. He denies hx of DM2 but stated his mom might have had DM. He has been drinking a lot of juices over the past few months. He said, "That's all I drink". I offered a lab appointment in our clinic tomorrow for a repeat lab test with repeat A1C. However, he declined; he prefers to come in on his HFU appointment date for lab work. He endorsed feeling better today. Red flag signs of hyperglycemia and indication for ED visit discussed. He verbalized understanding and agreed with the plan.  I'll forward to Dr. Fatima Blank whom he is scheduled to see to follow up on this.  - obtain Bmet - recheck A1C

## 2022-07-30 LAB — T.PALLIDUM AB, TOTAL: T Pallidum Abs: REACTIVE — AB

## 2022-07-30 LAB — SURGICAL PATHOLOGY

## 2022-07-30 NOTE — Telephone Encounter (Signed)
Pcc's can't place orders I believe this is needing a ct order

## 2022-07-30 NOTE — Telephone Encounter (Signed)
Ct chest ordered. (It was previously ordered and canceled.) needs to be in 3 months.

## 2022-08-01 ENCOUNTER — Encounter: Payer: Self-pay | Admitting: Gastroenterology

## 2022-08-06 ENCOUNTER — Ambulatory Visit (INDEPENDENT_AMBULATORY_CARE_PROVIDER_SITE_OTHER): Payer: No Typology Code available for payment source | Admitting: Family Medicine

## 2022-08-06 VITALS — BP 130/89 | HR 85 | Ht 71.0 in | Wt 195.6 lb

## 2022-08-06 DIAGNOSIS — A64 Unspecified sexually transmitted disease: Secondary | ICD-10-CM | POA: Diagnosis not present

## 2022-08-06 DIAGNOSIS — D649 Anemia, unspecified: Secondary | ICD-10-CM | POA: Diagnosis not present

## 2022-08-06 DIAGNOSIS — R748 Abnormal levels of other serum enzymes: Secondary | ICD-10-CM

## 2022-08-06 DIAGNOSIS — A539 Syphilis, unspecified: Secondary | ICD-10-CM | POA: Diagnosis not present

## 2022-08-06 DIAGNOSIS — R739 Hyperglycemia, unspecified: Secondary | ICD-10-CM

## 2022-08-06 DIAGNOSIS — K118 Other diseases of salivary glands: Secondary | ICD-10-CM

## 2022-08-06 DIAGNOSIS — R7989 Other specified abnormal findings of blood chemistry: Secondary | ICD-10-CM

## 2022-08-06 LAB — POCT GLYCOSYLATED HEMOGLOBIN (HGB A1C): Hemoglobin A1C: 6.5 % — AB (ref 4.0–5.6)

## 2022-08-06 MED ORDER — PENICILLIN G BENZATHINE 600000 UNIT/ML IM SUSY
2.4000 10*6.[IU] | PREFILLED_SYRINGE | Freq: Once | INTRAMUSCULAR | Status: DC
Start: 2022-08-06 — End: 2022-08-06

## 2022-08-06 MED ORDER — PENICILLIN G BENZATHINE 1200000 UNIT/2ML IM SUSY
1.2000 10*6.[IU] | PREFILLED_SYRINGE | Freq: Once | INTRAMUSCULAR | Status: AC
Start: 2022-08-06 — End: 2022-08-06
  Administered 2022-08-06: 1.2 10*6.[IU] via INTRAMUSCULAR

## 2022-08-06 NOTE — Progress Notes (Signed)
    SUBJECTIVE:   CHIEF COMPLAINT / HPI: hospital follow-up  PERTINENT  PMH / PSH: no known PMHx  Patient is following up after his hospitalization due to having B symptoms including fever, chills, night sweats, weight loss for 2 weeks secondary to syphilis.  CT abdomen pelvis demonstrated incidental findings of lung nodules, gastric wall thickening, and inguinal lymphadenopathy.  A liver MRI was also performed which showed benign hemangiomas.  An EGD was performed to evaluate his symptoms further and showed a 2 cm hiatal hernia, nonbleeding gastric ulcer, gastritis, and duodenitis with normal biopsies.  Towards the end of his hospitalization on physical exam, it was noted that there was a 2 cm keratotic, thickened, nontender penile tissue on the dorsal aspect of the penile shaft.  RPR was positive and patient was treated with 2.4 million units of penicillin G.   Patient feels better today and denies fever, chills, night sweats, weight loss which were his symptoms during his hospitalization.  However, he endorses having a sore on his left L glute that appeared 3 days ago. The lesion on his penis is improving due to applying Bactroban.  He has been compliant with his medications and has had more of an appetite since his hospitalization.   OBJECTIVE:   BP 130/89   Pulse 85   Ht 5\' 11"  (1.803 m)   Wt 195 lb 9.6 oz (88.7 kg)   SpO2 100%   BMI 27.28 kg/m   General: NAD, awake and alert Cardiovascular: RRR. No M/R/G Respiratory: CTAB, normal WOB on RA Abdomen: soft, nontender, nondistended Extremities: no BLE edema Derm: Indurated lesion on L butt cheek with a pinpoint scab-like appearance at the midpoint Neuro: A&Ox3. No focal neurological deficits.  ASSESSMENT/PLAN:   Syphilis Patient is follow up after a recent hospitalization for B symptoms secondary to syphilis. Treated with 2.4 million units of Penicillin G.  Due to his concern for the sore on his left glute, we recommended that the  patient be treated with 2 more doses of penicillin. 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 It has grown to 4.3 cm from 2.0 before.  - Recommended following up with Dr. Jearld Fenton (ENT) for a potential ultrasound-guided needle biopsy or his ENT from [facility], Dr. Allena Katz. Provided contact information for both.   Elevated LFTs Patient presented with a hepatic hemangiomas and elevated LFTs in the hospital likely due to dehydration. - Follow up with CMP    Fortunato Curling, DO [facility]

## 2022-08-06 NOTE — Assessment & Plan Note (Addendum)
Patient has had this mass for about 6 years and shares that he was evaluated for it at [facility]. It has grown to 4.3 cm from 2.0 before.  - Recommended following up with Dr. Jearld Fenton (ENT) for a potential ultrasound-guided needle biopsy or his ENT from [facility], Dr. Allena Katz. Provided contact information for both.

## 2022-08-06 NOTE — Assessment & Plan Note (Signed)
Patient presented with a hepatic hemangiomas and elevated LFTs in the hospital likely due to dehydration. - Follow up with CMP

## 2022-08-06 NOTE — Patient Instructions (Addendum)
Mr. Seba, it was great to see you!  Our plans for today:  - Giving you another dose of Pencillin G today. You should follow up for another lab visit next week to receive one more dose. - We will check your urine for sexually transmitted infections - Get blood work - Follow up with ENT regarding your parotid mass - GI will contact you to follow up with them in 3-4 months for a colonoscopy  Below are the contact information for the two ENT doctors, please call at your earliest convenience.  We are checking some labs today, we will release these results to your MyChart.  Take care and seek immediate care sooner if you develop any concerns.  Dr. Argie Ramming, MD. (ENT) Contact information: [address] Otolaryngology, [facility] [id number] Phys[address] [place] [address] [phone]   2. Dr. Suzanna Obey (ENT) [address] [place] [address] [place] [address] [phone]

## 2022-08-06 NOTE — Assessment & Plan Note (Addendum)
Patient is follow up after a recent hospitalization for B symptoms secondary to syphilis. Treated with 2.4 million units of Penicillin G.  Due to his concern for the sore on his left glute, we recommended that the patient be treated with 2 more doses of penicillin. - Patient got another dose of Pencillin G this week and will follow up in one week to get his final dose - Follow up on STI cytology results - Provided education about safe sex - Follow up on RPR in 6 months (January) and 12 months (July)

## 2022-08-07 LAB — COMPREHENSIVE METABOLIC PANEL
ALT: 29 IU/L (ref 0–44)
AST: 18 IU/L (ref 0–40)
Albumin: 4.5 g/dL (ref 3.8–4.9)
Alkaline Phosphatase: 179 IU/L — ABNORMAL HIGH (ref 44–121)
BUN/Creatinine Ratio: 15 (ref 9–20)
BUN: 13 mg/dL (ref 6–24)
Bilirubin Total: 0.6 mg/dL (ref 0.0–1.2)
CO2: 24 mmol/L (ref 20–29)
Calcium: 10.4 mg/dL — ABNORMAL HIGH (ref 8.7–10.2)
Chloride: 94 mmol/L — ABNORMAL LOW (ref 96–106)
Creatinine, Ser: 0.84 mg/dL (ref 0.76–1.27)
Globulin, Total: 3.9 g/dL (ref 1.5–4.5)
Glucose: 149 mg/dL — ABNORMAL HIGH (ref 70–99)
Potassium: 4.4 mmol/L (ref 3.5–5.2)
Sodium: 135 mmol/L (ref 134–144)
Total Protein: 8.4 g/dL (ref 6.0–8.5)
eGFR: 105 mL/min/{1.73_m2} (ref >59)

## 2022-08-07 LAB — CBC
Hematocrit: 37.1 % — ABNORMAL LOW (ref 37.5–51.0)
Hemoglobin: 12.1 g/dL — ABNORMAL LOW (ref 13.0–17.7)
MCH: 29.5 pg (ref 26.6–33.0)
MCHC: 32.6 g/dL (ref 31.5–35.7)
MCV: 91 fL (ref 79–97)
Platelets: 673 10*3/uL — ABNORMAL HIGH (ref 150–450)
RBC: 4.1 x10E6/uL — ABNORMAL LOW (ref 4.14–5.80)
RDW: 12.5 % (ref 11.6–15.4)
WBC: 8.7 10*3/uL (ref 3.4–10.8)

## 2022-08-09 ENCOUNTER — Other Ambulatory Visit: Payer: Self-pay | Admitting: Family Medicine

## 2022-08-09 DIAGNOSIS — A64 Unspecified sexually transmitted disease: Secondary | ICD-10-CM

## 2022-08-09 NOTE — Telephone Encounter (Signed)
I have ordered this CT Chest and recommended follow up with our office and it still isn't done. Can someone follow up on this?

## 2022-08-10 ENCOUNTER — Telehealth: Payer: Self-pay | Admitting: Family Medicine

## 2022-08-10 ENCOUNTER — Other Ambulatory Visit: Payer: Self-pay | Admitting: Family Medicine

## 2022-08-10 DIAGNOSIS — E119 Type 2 diabetes mellitus without complications: Secondary | ICD-10-CM

## 2022-08-10 NOTE — Telephone Encounter (Signed)
The order never hit our box it went under critical care we will get it scheduled since we can see it due to the message

## 2022-08-10 NOTE — Telephone Encounter (Signed)
Called patient and confirmed date of birth prior to discussing his recent lab work.  His CMP and CBC were stable after his hospitalization.  However due to having hyperglycemia with a glucose of 300 with an A1c of 5.6 in the hospital, we rechecked his sugars outpatient and they revealed a glucose of 149 and A1c of 6.5.  Patient is aware that this gives him a new diagnosis of diabetes.  He would like to try diet modifications first prior to starting medications.  Discussed with him that eating less carbohydrates and more lean protein and vegetables will be important. Referred him to Dr. Gerilyn Pilgrim and provided her number as well, he is eager and will call and schedule an appointment tomorrow.  Follow up in 3 months to reassess his diabetes and determine if Metformin would be indicated at that time if diet modifications his sugars remain elevated.   Fortunato Curling, DO [facility], PGY-1 08/10/22 8:36 PM

## 2022-08-10 NOTE — Progress Notes (Addendum)
Referred patient to Dr. Gerilyn Pilgrim for diabetic nutrition. Patient was provided the phone number to call and make an appointment.

## 2022-08-12 ENCOUNTER — Ambulatory Visit (INDEPENDENT_AMBULATORY_CARE_PROVIDER_SITE_OTHER): Payer: No Typology Code available for payment source | Admitting: Student

## 2022-08-12 ENCOUNTER — Other Ambulatory Visit: Payer: Self-pay

## 2022-08-12 ENCOUNTER — Encounter (HOSPITAL_COMMUNITY): Payer: Self-pay

## 2022-08-12 ENCOUNTER — Other Ambulatory Visit (HOSPITAL_COMMUNITY)
Admission: RE | Admit: 2022-08-12 | Discharge: 2022-08-12 | Disposition: A | Discharge status: Home / Self Care | Payer: No Typology Code available for payment source | Source: Ambulatory Visit

## 2022-08-12 ENCOUNTER — Ambulatory Visit: Payer: No Typology Code available for payment source

## 2022-08-12 ENCOUNTER — Emergency Department (HOSPITAL_COMMUNITY)
Admission: EM | Admit: 2022-08-12 | Discharge: 2022-08-12 | Disposition: A | Discharge status: Home / Self Care | Payer: No Typology Code available for payment source | Attending: Emergency Medicine | Admitting: Emergency Medicine

## 2022-08-12 VITALS — BP 106/80 | HR 82 | Wt 195.0 lb

## 2022-08-12 DIAGNOSIS — A64 Unspecified sexually transmitted disease: Secondary | ICD-10-CM | POA: Insufficient documentation

## 2022-08-12 DIAGNOSIS — A539 Syphilis, unspecified: Secondary | ICD-10-CM

## 2022-08-12 DIAGNOSIS — L03317 Cellulitis of buttock: Secondary | ICD-10-CM | POA: Diagnosis not present

## 2022-08-12 DIAGNOSIS — L0390 Cellulitis, unspecified: Secondary | ICD-10-CM | POA: Diagnosis not present

## 2022-08-12 DIAGNOSIS — L0231 Cutaneous abscess of buttock: Secondary | ICD-10-CM | POA: Insufficient documentation

## 2022-08-12 HISTORY — DX: Syphilis, unspecified: A53.9

## 2022-08-12 LAB — BASIC METABOLIC PANEL
Anion gap: 12 (ref 5–15)
BUN: 7 mg/dL (ref 6–20)
CO2: 24 mmol/L (ref 22–32)
Calcium: 9.6 mg/dL (ref 8.9–10.3)
Chloride: 95 mmol/L — ABNORMAL LOW (ref 98–111)
Creatinine, Ser: 0.85 mg/dL (ref 0.61–1.24)
GFR, Estimated: >60 mL/min (ref >60)
Glucose, Bld: 130 mg/dL — ABNORMAL HIGH (ref 70–99)
Potassium: 4 mmol/L (ref 3.5–5.1)
Sodium: 131 mmol/L — ABNORMAL LOW (ref 135–145)

## 2022-08-12 LAB — CBC WITH DIFFERENTIAL/PLATELET
Abs Immature Granulocytes: 0.06 10*3/uL (ref 0.00–0.07)
Basophils Absolute: 0.1 10*3/uL (ref 0.0–0.1)
Basophils Relative: 1 %
Eosinophils Absolute: 0 10*3/uL (ref 0.0–0.5)
Eosinophils Relative: 0 %
HCT: 38.3 % — ABNORMAL LOW (ref 39.0–52.0)
Hemoglobin: 12.2 g/dL — ABNORMAL LOW (ref 13.0–17.0)
Immature Granulocytes: 0 %
Lymphocytes Relative: 16 %
Lymphs Abs: 2.3 10*3/uL (ref 0.7–4.0)
MCH: 28.6 pg (ref 26.0–34.0)
MCHC: 31.9 g/dL (ref 30.0–36.0)
MCV: 89.7 fL (ref 80.0–100.0)
Monocytes Absolute: 1.1 10*3/uL — ABNORMAL HIGH (ref 0.1–1.0)
Monocytes Relative: 8 %
Neutro Abs: 10.6 10*3/uL — ABNORMAL HIGH (ref 1.7–7.7)
Neutrophils Relative %: 75 %
Platelets: 535 10*3/uL — ABNORMAL HIGH (ref 150–400)
RBC: 4.27 MIL/uL (ref 4.22–5.81)
RDW: 13.1 % (ref 11.5–15.5)
WBC: 14.3 10*3/uL — ABNORMAL HIGH (ref 4.0–10.5)
nRBC: 0 % (ref 0.0–0.2)

## 2022-08-12 MED ORDER — LIDOCAINE HCL (PF) 1 % IJ SOLN
30.0000 mL | Freq: Once | INTRAMUSCULAR | Status: AC
  Administered 2022-08-12: 30 mL via INTRADERMAL
  Filled 2022-08-12: qty 30

## 2022-08-12 MED ORDER — DOXYCYCLINE HYCLATE 100 MG PO CAPS: 100.0000 mg | ORAL_CAPSULE | Freq: Two times a day (BID) | ORAL | 0 refills | Status: AC

## 2022-08-12 MED ORDER — HYDROCODONE-ACETAMINOPHEN 5-325 MG PO TABS: 1.0000 | ORAL_TABLET | Freq: Four times a day (QID) | ORAL | 0 refills | Status: AC | PRN

## 2022-08-12 MED ORDER — BACITRACIN ZINC 500 UNIT/GM EX OINT: TOPICAL_OINTMENT | Freq: Two times a day (BID) | CUTANEOUS | Status: DC

## 2022-08-12 MED ORDER — OXYCODONE-ACETAMINOPHEN 5-325 MG PO TABS
1.0000 | ORAL_TABLET | Freq: Once | ORAL | Status: AC
  Administered 2022-08-12: 1 via ORAL
  Filled 2022-08-12: qty 1

## 2022-08-12 NOTE — Discharge Instructions (Addendum)
Today you have an abscess on your buttocks that was drained.  I have prescribed you antibiotics to take and a general surgeon to follow-up with.  He also had packing placed and will need to return in 2 days to have your wound rechecked to ensure your wound is healing.  I have also prescribed you pain meds to take.  Please take Tylenol every 6 hours needed for pain however if the pain is after the Tylenol you may take the Norco I have prescribed you.  If symptoms change or worsen please return to ER.

## 2022-08-12 NOTE — Assessment & Plan Note (Signed)
Was due to receive syphilis treatment today with 2,400,000 units penicillin G, but sent to ER for management of buttocks wound.

## 2022-08-12 NOTE — Progress Notes (Cosign Needed Addendum)
    SUBJECTIVE:   CHIEF COMPLAINT / HPI:   Buttocks wound Patient initially presented today for penicillin injection for syphilis infection.  This was not completed due to RN notified about wound on bottom. This was noted on recent office visit from 7/12 as an indurated lesion on left glutes with pinpoint scab.  It is far larger in size today.  Reports fevers and chills.  Says he is unable to sleep at night due to pain.  Denies any drainage.  Denies testicular pain.  Denies other wounds elsewhere.  Says he has had something like this in the past, but this is much worse.  PERTINENT  PMH / PSH: Reviewed  OBJECTIVE:   BP 106/80   Pulse 82   Wt 195 lb (88.5 kg)   SpO2 98%   BMI 27.20 kg/m    General: NAD, pleasant, able to participate in exam Cardiac: RRR, no murmurs. Respiratory: CTAB, normal effort, No wheezes, rales or rhonchi Abdomen: Bowel sounds present, nontender, nondistended, no hepatosplenomegaly. Extremities: no edema or cyanosis. Neuro: alert, no obvious focal deficits Psych: Normal affect and mood Skin, left buttocks: Large indurated central lesion with skin peeling, some with surrounding 10 x 13 cm warm and firm erythematous skin   ASSESSMENT/PLAN:   Wound cellulitis Large indurated, central wound with surrounding 10 x 13 cm warm, firm, erythematous skin concerning for cellulitis with likely underlying abscess on left buttocks.  See image under media tab. He is having systemic symptoms such as fever and chills. Reassuringly, no scrotal or penile lesions or pain and he is generally well-appearing. He is a well-controlled diabetic with A1c 6.5%.  Does not smoke. Recommended to go immediately to the ER (due to physical exam findings with systemic symptoms) for further imaging with CT to classify severity and depth of infection, likely admission for IV antibiotics, and possible general surgery consult for I&D under anesthesia. RN transported patient to ER. Seen with  attending, Dr. Terisa Starr who agrees with above plan. I called and spoke with ED charge nurse about expecting patient's arrival.  Syphilis Was due to receive syphilis treatment today with 2,400,000 units penicillin G, but sent to ER for management of buttocks wound.     Dr. Darral Dash [facility]

## 2022-08-12 NOTE — Progress Notes (Addendum)
Error

## 2022-08-12 NOTE — ED Triage Notes (Signed)
Pt sent by PCP for cellulitis of left buttocks. Pt states he is supposed to get a PCN injection for syphillis.

## 2022-08-12 NOTE — Assessment & Plan Note (Signed)
Large indurated, central wound with surrounding 10 x 13 cm warm, firm, erythematous skin concerning for cellulitis with likely underlying abscess on left buttocks.  See image under media tab. He is having systemic symptoms such as fever and chills. Reassuringly, no scrotal or penile lesions or pain and he is generally well-appearing. He is a well-controlled diabetic with A1c 6.5%.  Does not smoke. Recommended to go immediately to the ER (due to physical exam findings with systemic symptoms) for further imaging with CT to classify severity and depth of infection, likely admission for IV antibiotics, and possible general surgery consult for I&D under anesthesia. RN transported patient to ER with wheelchair. I called and spoke with ED charge nurse about expecting patient's arrival.

## 2022-08-12 NOTE — ED Notes (Signed)
Wound cleaned and gauze placed over site

## 2022-08-12 NOTE — ED Notes (Signed)
Pt verbalized understanding that he will need someone to drive him home after taking narcotics

## 2022-08-12 NOTE — ED Provider Notes (Signed)
[place] EMERGENCY DEPARTMENT AT [facility] Provider Note   CSN: [id number] Arrival date & time: 08/12/22  1226     History  Chief Complaint  Patient presents with   cellulitis    Robert Mccoy is a 52 y.o. male presented with left gluteal abscess over the past 7 days.  Patient states that he has been getting penicillin shots for his syphilis that he is currently being treated for and began having symptoms after getting a shot in his left buttocks.  Patient dates he is felt warm at home and that it hurts to lie down on his butt is the swelling is getting worse.  Patient states he still able to urinate and have bowel moods without issue.  Patient denies any abdominal pain nausea/vomiting, chest pain, shortness of breath, testicular pain/swelling.  Patient denies immunocompromise state or blood thinners.    Home Medications Prior to Admission medications   Medication Sig Start Date End Date Taking? Authorizing Provider  doxycycline (VIBRAMYCIN) 100 MG capsule Take 1 capsule (100 mg total) by mouth 2 (two) times daily for 7 days. 08/12/22 08/19/22 Yes Aftyn Nott, Beverly Gust, PA-C  HYDROcodone-acetaminophen (NORCO) 5-325 MG tablet Take 1 tablet by mouth every 6 (six) hours as needed for up to 5 days for severe pain. 08/12/22 08/17/22 Yes Jodel Mayhall, Beverly Gust, PA-C  mupirocin ointment (BACTROBAN) 2 % Apply topically 2 (two) times daily. 07/28/22   Fortunato Curling, DO  pantoprazole (PROTONIX) 40 MG tablet Take 1 tablet (40 mg total) by mouth 2 (two) times daily before a meal. 07/28/22   Fortunato Curling, DO  sucralfate (CARAFATE) 1 g tablet Take 1 tablet (1 g total) by mouth 2 (two) times daily. 07/28/22   Fortunato Curling, DO      Allergies    Patient has no known allergies.    Review of Systems   Review of Systems See HPI Physical Exam Updated Vital Signs BP 126/77   Pulse (!) 125   Temp 98.8 F (37.1 C) (Oral)   Resp 16   Ht 5\' 11"  (1.803 m)   Wt 88.5 kg   SpO2 100%   BMI 27.21  kg/m  Physical Exam Constitutional:      General: He is not in acute distress. Genitourinary:    Comments: Testicles are not edematous or erythematous or tender to palpation Skin:    General: Skin is warm and dry.     Comments: Chaperone: Mullis, [place], RN Left gluteal area of fluctuance noted that does not track into the rectum or perineum Area of fluctuance noted on left buttocks with erythema No gangrene noted or perineum/rectal abnormalities noted  Neurological:     Mental Status: He is alert.     ED Results / Procedures / Treatments   Labs (all labs ordered are listed, but only abnormal results are displayed) Labs Reviewed  BASIC METABOLIC PANEL - Abnormal; Notable for the following components:      Result Value   Sodium 131 (*)    Chloride 95 (*)    Glucose, Bld 130 (*)    All other components within normal limits  CBC WITH DIFFERENTIAL/PLATELET - Abnormal; Notable for the following components:   WBC 14.3 (*)    Hemoglobin 12.2 (*)    HCT 38.3 (*)    Platelets 535 (*)    Neutro Abs 10.6 (*)    Monocytes Absolute 1.1 (*)    All other components within normal limits    EKG None  Radiology  No results found.  Procedures .Marland KitchenIncision and Drainage  Date/Time: 08/12/2022 5:27 PM  Performed by: Netta Corrigan, PA-C Authorized by: Netta Corrigan, PA-C   Consent:    Consent obtained:  Verbal   Consent given by:  Patient   Risks, benefits, and alternatives were discussed: yes     Risks discussed:  Bleeding, damage to other organs, incomplete drainage, infection and pain   Alternatives discussed:  Referral Location:    Type:  Abscess   Size:  5 cm   Location:  Anogenital   Anogenital location: Left buttocks. Pre-procedure details:    Skin preparation:  Povidone-iodine Sedation:    Sedation type:  None Anesthesia:    Anesthesia method:  Local infiltration   Local anesthetic:  Lidocaine 1% w/o epi Procedure type:    Complexity:  Complex Procedure  details:    Ultrasound guidance: no     Needle aspiration: no     Incision types:  Single straight   Incision depth:  Dermal   Wound management:  Probed and deloculated   Drainage:  Purulent and bloody   Drainage amount:  Moderate   Wound treatment:  Wound left open   Packing materials:  1/4 in iodoform gauze   Amount 1/4" iodoform:  2.5 inches Post-procedure details:    Procedure completion:  Tolerated well, no immediate complications     Medications Ordered in ED Medications  bacitracin ointment (has no administration in time range)  lidocaine (PF) (XYLOCAINE) 1 % injection 30 mL (30 mLs Intradermal Given 08/12/22 1639)  oxyCODONE-acetaminophen (PERCOCET/ROXICET) 5-325 MG per tablet 1 tablet (1 tablet Oral Given 08/12/22 1639)    ED Course/ Medical Decision Making/ A&P                             Medical Decision Making Amount and/or Complexity of Data Reviewed Labs: ordered.  Risk Prescription drug management.   [name] 52 y.o. presented today for abscess. Working DDx that I considered at this time includes, but not limited to, abscess, cellulitis, Fournier's gangrene, necrotizing fasciitis, ischial rectal abscess.  R/o DDx: cellulitis, Fournier's gangrene, necrotizing fasciitis, ischial rectal abscess: These are considered less likely due to history of present illness and physical exam findings  Review of prior external notes: 07/26/2022 discharge  Unique Tests and My Interpretation:  CBC: Leukocytosis 14.3 BMP: Unremarkable  Discussion with Independent Historian: None  Discussion of Management of Tests: None  Risk: Medium: prescription drug management  Risk Stratification Score: None  Staffed with Adela Lank, DO  Plan: On exam patient was in no acute distress but was tachycardic 125 due to his discomfort.  With a chaperone in the room a GU exam was conducted which shows an abscess on his left buttocks that was roughly 5 cm in diameter without  perineum or rectal involvement however the attending was notified which he agreed with these findings.  Patient was informed of the risk and benefit of doing an incision and drainage today and verbalizes acceptance of the risk and so we will drain his abscess.  Abscess was drained with purulent discharge noted so patient will be placed on antibiotics and encouraged to follow-up with general surgery as patient may need more drainage.  Patient also had packing placed due to the extent of the abscess and was encouraged to return in 48 hours to have a wound check.  Patient will be given Norco for pain not controlled by Tylenol and encouraged  return to ER symptoms are to change or worsen.  Patient was given return precautions. Patient stable for discharge at this time.  Patient verbalized understanding of plan.         Final Clinical Impression(s) / ED Diagnoses Final diagnoses:  Gluteal abscess    Rx / DC Orders ED Discharge Orders          Ordered    doxycycline (VIBRAMYCIN) 100 MG capsule  2 times daily        08/12/22 1710    HYDROcodone-acetaminophen (NORCO) 5-325 MG tablet  Every 6 hours PRN        08/12/22 1713              Netta Corrigan, PA-C 08/12/22 1729    Melene Plan, DO 08/13/22 [phone]

## 2022-08-13 ENCOUNTER — Encounter: Payer: Self-pay | Admitting: Family Medicine

## 2022-08-13 LAB — URINE CYTOLOGY ANCILLARY ONLY
Chlamydia: NEGATIVE
Comment: NEGATIVE
Comment: NEGATIVE
Comment: NORMAL
Neisseria Gonorrhea: NEGATIVE
Trichomonas: NEGATIVE

## 2022-08-13 NOTE — Telephone Encounter (Signed)
CT chest has been scheduled 10/28/22 at [facility].

## 2022-08-16 ENCOUNTER — Other Ambulatory Visit: Payer: Self-pay

## 2022-08-16 ENCOUNTER — Ambulatory Visit (INDEPENDENT_AMBULATORY_CARE_PROVIDER_SITE_OTHER): Payer: No Typology Code available for payment source | Admitting: Student

## 2022-08-16 VITALS — BP 101/65 | HR 80 | Ht 71.0 in | Wt 193.8 lb

## 2022-08-16 DIAGNOSIS — L0231 Cutaneous abscess of buttock: Secondary | ICD-10-CM | POA: Diagnosis not present

## 2022-08-16 DIAGNOSIS — A64 Unspecified sexually transmitted disease: Secondary | ICD-10-CM

## 2022-08-16 MED ORDER — PENICILLIN G BENZATHINE 1200000 UNIT/2ML IM SUSY
1.2000 10*6.[IU] | PREFILLED_SYRINGE | Freq: Once | INTRAMUSCULAR | Status: AC
Start: 2022-08-16 — End: 2022-08-16
  Administered 2022-08-16: 1.2 10*6.[IU] via INTRAMUSCULAR

## 2022-08-16 NOTE — Progress Notes (Signed)
    SUBJECTIVE:   CHIEF COMPLAINT / HPI:  Patient is a 52 year old male presenting today for ED follow-up.  He was recently seen in the ED for gluteal abscess that was drained and patient was sent home on doxycycline and Norco for pain.  Today patient said he is doing, well pain is well-controlled and the swelling has improved since the drainage in the ED.  Draining has improved and he has been using warm water compress over the wound and dressing with gauze.  He denies any fevers or chills.    PERTINENT  PMH / PSH: Reviewed   OBJECTIVE:   BP 101/65   Pulse 80   Ht 5\' 11"  (1.803 m)   Wt 193 lb 12.8 oz (87.9 kg)   SpO2 97%   BMI 27.03 kg/m    Physical Exam General: Alert, well appearing, NAD Cardiovascular: RRR, Well perfused Respiratory: CTAB, No wheezing or Rales (L) Gluteal wound: Open inspection about 10mm with surrounding thickness and hyperpigmentation but no notable drainage or surround erythema.   Chaperon:  Dayshia CMA  ASSESSMENT/PLAN:    Left gluteal abscess Incisional sites for the gluteal abscess looks to be healing appropriately no signs of infection at this time.  Pain is well-controlled. -Encourage patient to continue warm compress and dressed with gauze -Continue with doxycycline as prescribed -Use Tylenol/ibuprofen for pain -Follow-up in 3 days to reassess wound.  Syphilis Patient was due for his syphilis of 2, 400, 000 units penicillin G for treatment of syphillis on 7/18.  However he had gone to the ED for Buttock wound.  RPR was reactive with titer of 1:16. -Received 2.44mil Unit of penicillin G -Serology titer should be reassessed in 6-12 months.    Jerre Simon, MD [facility]

## 2022-08-16 NOTE — Patient Instructions (Signed)
It was wonderful to meet you today. Thank you for allowing me to be a part of your care. Below is a short summary of what we discussed at your visit today:  The wound from the incision looks to be healing well. No sign of infection.  I recommend continue warm water compress with either showering or towel.  Continue to dress it with white gauze.  Make sure to finish your antibiotics.  Please return to the clinic in 3 days lets reassess.  Also today we gave you a second dose of the penicillin for your syphilis.   Please bring all of your medications to every appointment!  If you have any questions or concerns, please do not hesitate to contact us via phone or MyChart message.   Jerre Simon, MD [facility]

## 2022-10-13 ENCOUNTER — Encounter: Payer: Self-pay | Admitting: Internal Medicine

## 2022-10-28 ENCOUNTER — Other Ambulatory Visit: Payer: No Typology Code available for payment source

## 2023-04-08 ENCOUNTER — Encounter (HOSPITAL_COMMUNITY): Payer: Self-pay

## 2023-04-08 ENCOUNTER — Other Ambulatory Visit: Payer: Self-pay

## 2023-04-08 ENCOUNTER — Emergency Department (HOSPITAL_COMMUNITY)
Admission: EM | Admit: 2023-04-08 | Discharge: 2023-04-08 | Disposition: A | Discharge status: Home / Self Care | Attending: Student | Admitting: Student

## 2023-04-08 ENCOUNTER — Other Ambulatory Visit (HOSPITAL_COMMUNITY): Payer: Self-pay

## 2023-04-08 DIAGNOSIS — K0889 Other specified disorders of teeth and supporting structures: Secondary | ICD-10-CM | POA: Diagnosis present

## 2023-04-08 DIAGNOSIS — K029 Dental caries, unspecified: Secondary | ICD-10-CM | POA: Diagnosis not present

## 2023-04-08 MED ORDER — KETOROLAC TROMETHAMINE 15 MG/ML IJ SOLN
15.0000 mg | Freq: Once | INTRAMUSCULAR | Status: AC
  Administered 2023-04-08: 15 mg via INTRAMUSCULAR
  Filled 2023-04-08: qty 1

## 2023-04-08 MED ORDER — AMOXICILLIN-POT CLAVULANATE 875-125 MG PO TABS
1.0000 | ORAL_TABLET | Freq: Two times a day (BID) | ORAL | 0 refills | Status: AC
  Filled 2023-04-08: qty 14, 7d supply, fill #0

## 2023-04-08 NOTE — ED Provider Notes (Signed)
 [place] EMERGENCY DEPARTMENT AT [facility] Provider Note   CSN: [id number] Arrival date & time: 04/08/23  1131     History  Chief Complaint  Patient presents with   Dental Pain    Robert Mccoy is a 53 y.o. male who presents to the ED for evaluation of right lower dental pain.  States pain has been ongoing for the last 6 days.  Reports he has not seen a dentist in "forever".  He states he was taking Tylenol and ibuprofen at home which will help with the pain but continues to have swelling.  Denies fevers, nausea, vomiting, body aches, chills at home.    Dental Pain Associated symptoms: no facial swelling and no fever        Home Medications Prior to Admission medications   Medication Sig Start Date End Date Taking? Authorizing Provider  amoxicillin-clavulanate (AUGMENTIN) 875-125 MG tablet Take 1 tablet by mouth every 12 (twelve) hours for 7 days. 04/08/23 04/15/23 Yes Al Decant, PA-C  mupirocin ointment (BACTROBAN) 2 % Apply topically 2 (two) times daily. 07/28/22   Fortunato Curling, DO  pantoprazole (PROTONIX) 40 MG tablet Take 1 tablet (40 mg total) by mouth 2 (two) times daily before a meal. 07/28/22   Fortunato Curling, DO  sucralfate (CARAFATE) 1 g tablet Take 1 tablet (1 g total) by mouth 2 (two) times daily. 07/28/22   Fortunato Curling, DO      Allergies    Patient has no known allergies.    Review of Systems   Review of Systems  Constitutional:  Negative for fever.  HENT:  Positive for dental problem. Negative for facial swelling.   Gastrointestinal:  Negative for nausea and vomiting.  All other systems reviewed and are negative.   Physical Exam Updated Vital Signs BP (!) 145/90 (BP Location: Right Arm)   Pulse 70   Temp 98.4 F (36.9 C)   Resp 16   Ht 5\' 11"  (1.803 m)   Wt 95.3 kg   SpO2 100%   BMI 29.29 kg/m  Physical Exam Vitals and nursing note reviewed.  Constitutional:      General: He is not in acute distress.     Appearance: He is well-developed.  HENT:     Head: Normocephalic and atraumatic.     Mouth/Throat:     Mouth: Mucous membranes are moist.     Dentition: Dental caries present. No dental abscesses.     Pharynx: No oropharyngeal exudate or posterior oropharyngeal erythema.     Comments: Various dental caries throughout.  Patient has a clearly chipped second premolar.  No obvious dental abscess in need of drainage.  Uvula midline.  Handling secretions appropriately. Eyes:     Conjunctiva/sclera: Conjunctivae normal.  Cardiovascular:     Rate and Rhythm: Normal rate and regular rhythm.     Heart sounds: No murmur heard. Pulmonary:     Effort: Pulmonary effort is normal. No respiratory distress.     Breath sounds: Normal breath sounds.  Abdominal:     Palpations: Abdomen is soft.     Tenderness: There is no abdominal tenderness.  Musculoskeletal:        General: No swelling.     Cervical back: Neck supple.  Skin:    General: Skin is warm and dry.     Capillary Refill: Capillary refill takes less than 2 seconds.  Neurological:     Mental Status: He is alert.  Psychiatric:  Mood and Affect: Mood normal.     ED Results / Procedures / Treatments   Labs (all labs ordered are listed, but only abnormal results are displayed) Labs Reviewed - No data to display  EKG None  Radiology No results found.  Procedures Procedures   Medications Ordered in ED Medications  ketorolac (TORADOL) 15 MG/ML injection 15 mg (has no administration in time range)    ED Course/ Medical Decision Making/ A&P    Medical Decision Making  53 year old presents for evaluation.  Please see HPI for further details.  On examination, the patient has multiple dental caries throughout.  He has a clearly chipped second bicuspid (second premolar).  There is no abscess in need of drainage.  His uvula is midline.  He is handling secretions appropriately.  There is no drooling or change in phonation.  He  is overall nontoxic in appearance with reassuring vital signs.  No tachycardia, no fever.  Patient was given Toradol shot for pain.  Will provide him with 7 days of Augmentin which she will take twice daily.  Will provide him with dental resource guide.  He was given return precautions to include facial swelling, fevers, nausea or vomiting and he voiced understanding.  He states he will follow-up with dentistry.  He is stable to discharge home.   Final Clinical Impression(s) / ED Diagnoses Final diagnoses:  Pain due to dental caries    Rx / DC Orders ED Discharge Orders          Ordered    amoxicillin-clavulanate (AUGMENTIN) 875-125 MG tablet  Every 12 hours        04/08/23 1213              Clent Ridges 04/08/23 1214    Glendora Score, MD 04/08/23 2252

## 2023-04-08 NOTE — ED Triage Notes (Signed)
 Pt c/o right lower jaw/tooth pain for past few days. Pt has taken tylenol and ibuprofen and swelling has increased.

## 2023-04-08 NOTE — Discharge Instructions (Addendum)
 It was a pleasure taking part in your care.  As discussed, I am starting on antibiotics called Augmentin.  Please take this twice a day for 7 days.  Antibiotics may cause GI upset so please purchase probiotics such as Activia yogurt.  Please continue taking ibuprofen and Tylenol for pain and swelling.  May also purchase Orajel.  Please follow-up with dentist femoral dental resource guide attached to your AVS.  Please return to the ED with any new or worsening symptoms such as nausea, vomiting, fevers.

## 2023-09-18 IMAGING — CR DG FINGER INDEX 2+V*R*
3 series · 3 of 3 positions shown · non-contrast
Comparison: None.

CLINICAL DATA: Trauma with pain

EXAM:
RIGHT INDEX FINGER 2+V

[x finger pa right]
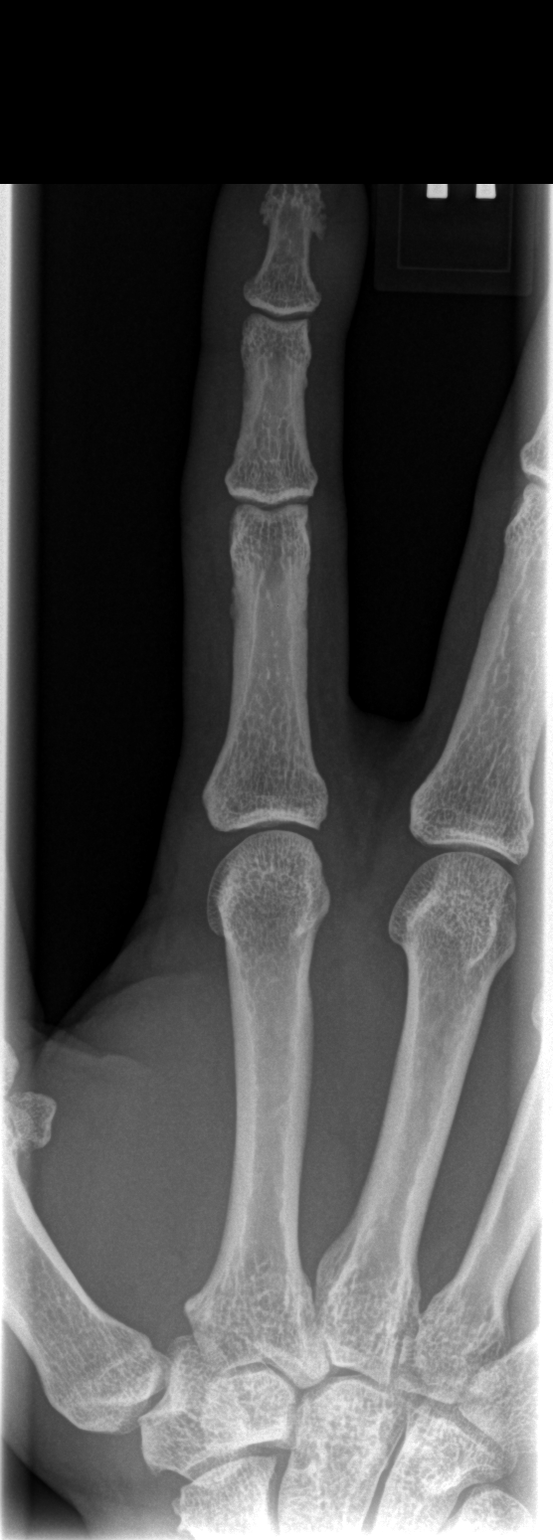

[x finger obl. right]
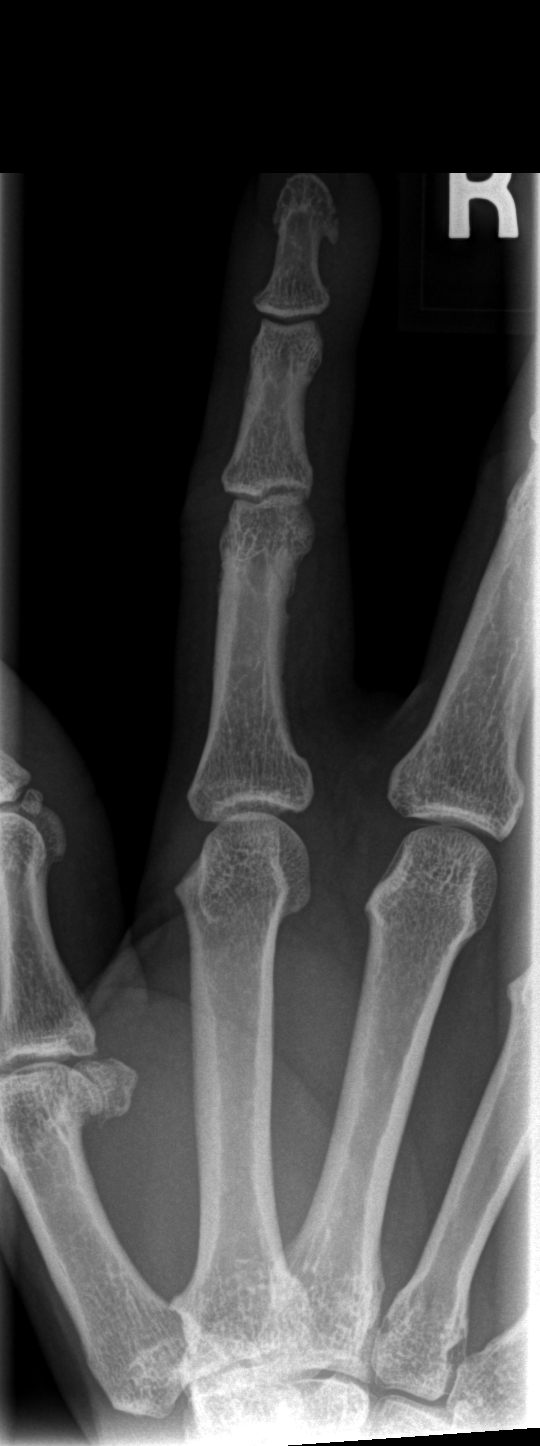

[x finger lateral right]
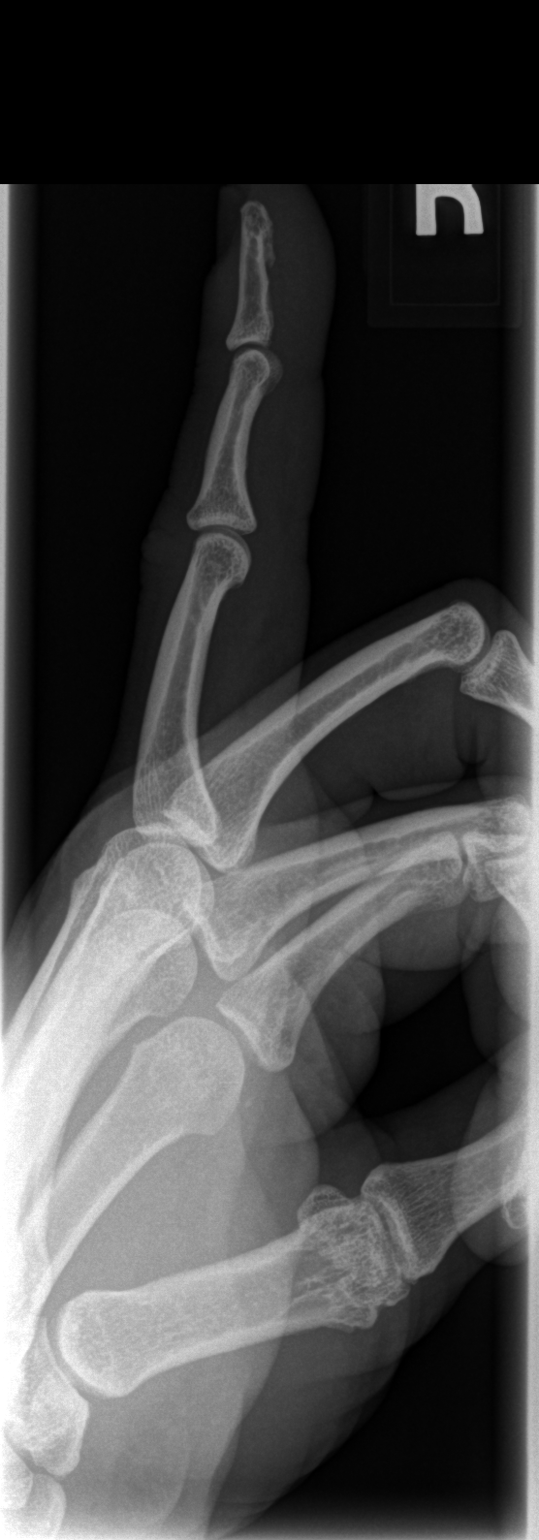

[3 of 3 positions shown; findings below may reference images not displayed]

FINDINGS: No acute fracture or dislocation. Mild soft tissue swelling about
the second digit. No radiopaque foreign object.
IMPRESSION: No acute osseous abnormality.

## 2023-09-26 IMAGING — DX DG TOE GREAT 2+V*L*
3 series · 3 of 3 positions shown · non-contrast
Comparison: None.

CLINICAL DATA: Left toe wound for 2 weeks

EXAM:
LEFT GREAT TOE

[x toes ap left]
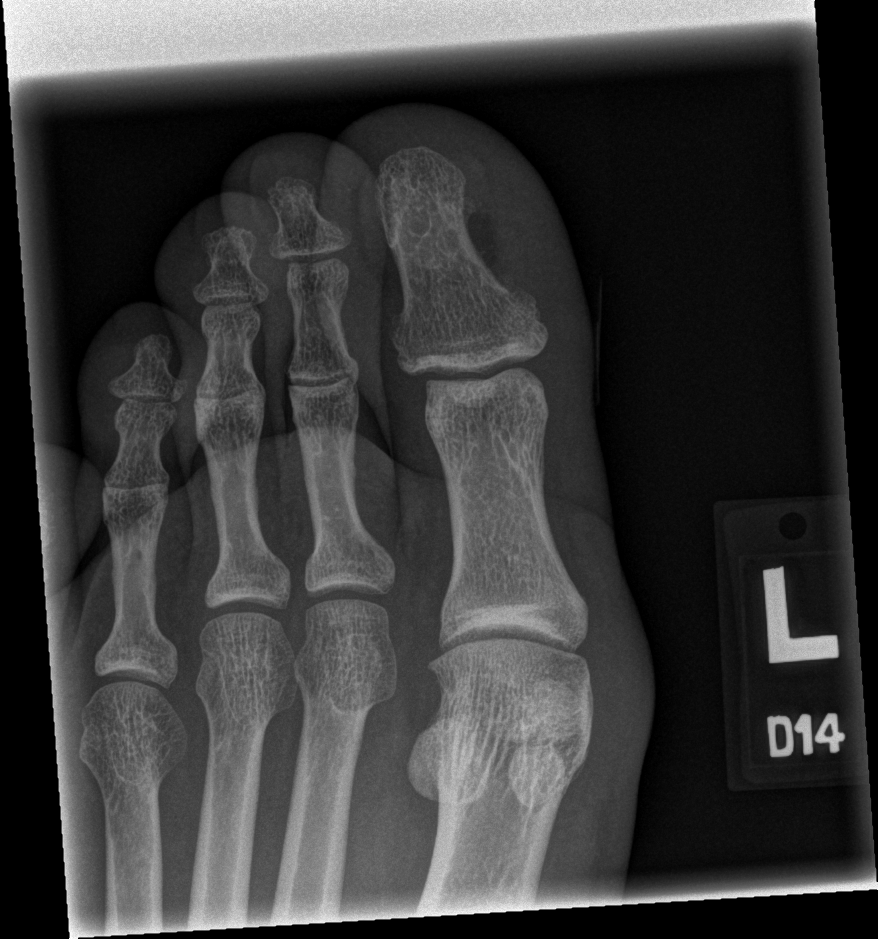

[x toes obl left]
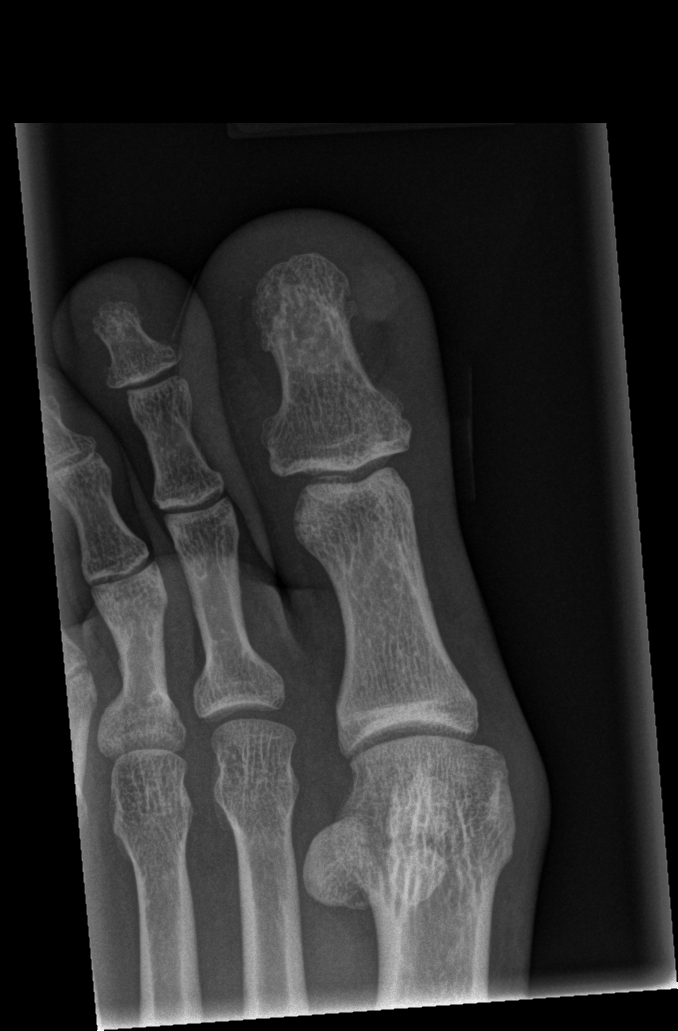

[x toes lat left]
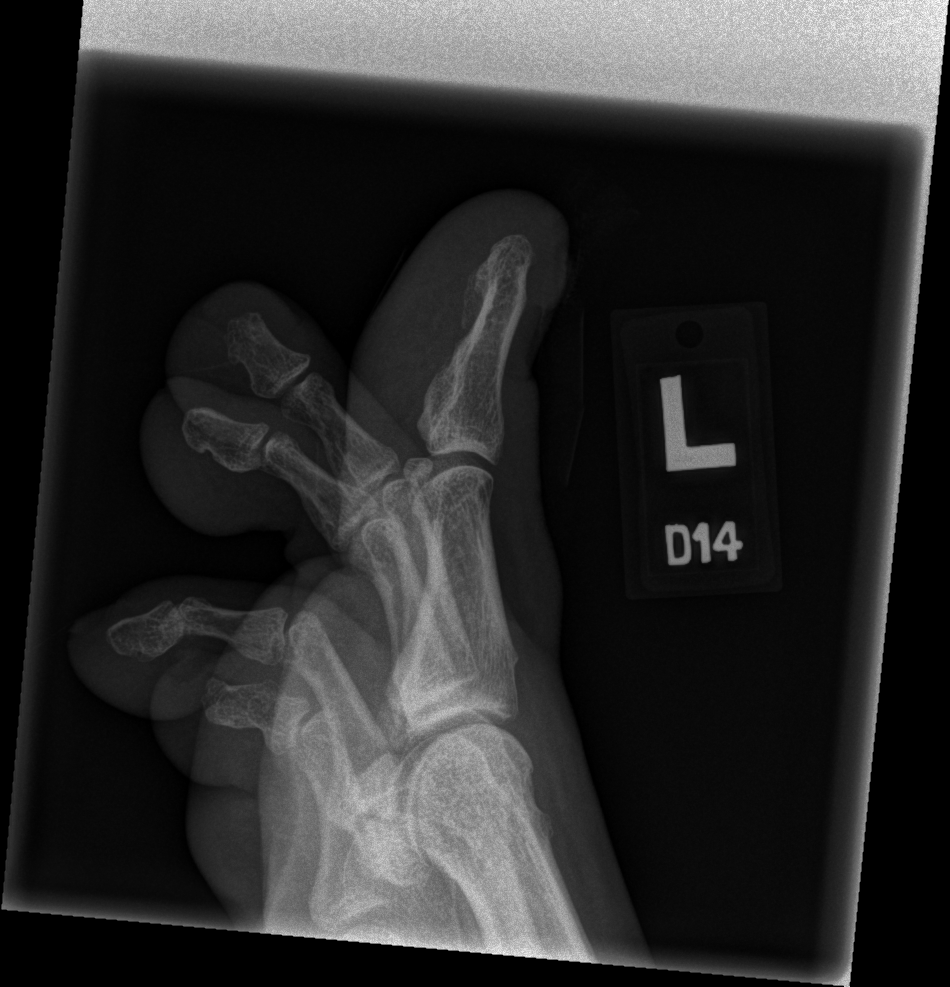

[3 of 3 positions shown; findings below may reference images not displayed]

FINDINGS: There is no evidence of fracture or dislocation. There is no
evidence of arthropathy or other focal bone abnormality. No evidence
of bony erosion or sclerosis. Soft tissue wound of the medial distal
left great toe with diffuse soft tissue edema about the digit.
IMPRESSION: 1. No fracture or dislocation of the left great toe. No radiographic
findings to suggest osteomyelitis.

2. Soft tissue wound of the medial distal left great toe with
diffuse soft tissue edema about the digit.

## 2024-01-26 ENCOUNTER — Emergency Department (HOSPITAL_COMMUNITY): Admission: EM | Admit: 2024-01-26 | Discharge: 2024-01-26 | Disposition: A | Discharge status: Home / Self Care

## 2024-01-26 ENCOUNTER — Other Ambulatory Visit: Payer: Self-pay

## 2024-01-26 ENCOUNTER — Encounter (HOSPITAL_COMMUNITY): Payer: Self-pay

## 2024-01-26 DIAGNOSIS — K029 Dental caries, unspecified: Secondary | ICD-10-CM | POA: Diagnosis not present

## 2024-01-26 DIAGNOSIS — K0889 Other specified disorders of teeth and supporting structures: Secondary | ICD-10-CM | POA: Diagnosis present

## 2024-01-26 LAB — CBC WITH DIFFERENTIAL/PLATELET
Abs Immature Granulocytes: 0.02 K/uL (ref 0.00–0.07)
Basophils Absolute: 0.1 K/uL (ref 0.0–0.1)
Basophils Relative: 1 %
Eosinophils Absolute: 0.1 K/uL (ref 0.0–0.5)
Eosinophils Relative: 1 %
HCT: 40.5 % (ref 39.0–52.0)
Hemoglobin: 13.3 g/dL (ref 13.0–17.0)
Immature Granulocytes: 0 %
Lymphocytes Relative: 21 %
Lymphs Abs: 1.3 K/uL (ref 0.7–4.0)
MCH: 30.9 pg (ref 26.0–34.0)
MCHC: 32.8 g/dL (ref 30.0–36.0)
MCV: 94 fL (ref 80.0–100.0)
Monocytes Absolute: 0.5 K/uL (ref 0.1–1.0)
Monocytes Relative: 9 %
Neutro Abs: 4.3 K/uL (ref 1.7–7.7)
Neutrophils Relative %: 68 %
Platelets: 254 K/uL (ref 150–400)
RBC: 4.31 MIL/uL (ref 4.22–5.81)
RDW: 12.4 % (ref 11.5–15.5)
WBC: 6.2 K/uL (ref 4.0–10.5)
nRBC: 0 % (ref 0.0–0.2)

## 2024-01-26 LAB — BASIC METABOLIC PANEL WITH GFR
Anion gap: 10 (ref 5–15)
BUN: 14 mg/dL (ref 6–20)
CO2: 23 mmol/L (ref 22–32)
Calcium: 9.6 mg/dL (ref 8.9–10.3)
Chloride: 103 mmol/L (ref 98–111)
Creatinine, Ser: 0.73 mg/dL (ref 0.61–1.24)
GFR, Estimated: >60 mL/min
Glucose, Bld: 114 mg/dL — ABNORMAL HIGH (ref 70–99)
Potassium: 4 mmol/L (ref 3.5–5.1)
Sodium: 136 mmol/L (ref 135–145)

## 2024-01-26 MED ORDER — AMOXICILLIN-POT CLAVULANATE 875-125 MG PO TABS: 1.0000 | ORAL_TABLET | Freq: Two times a day (BID) | ORAL | 0 refills | Status: AC

## 2024-01-26 NOTE — Discharge Instructions (Signed)
 Is a patient taking care of you today.  Your workup was reassuring.  You do not have evidence of infection on your blood work.  I do recommend taking Tylenol  and ibuprofen for pain management.  You may alternate between these medications every 3 hours as needed.  I have also provided you with a referral guide to local dentist in the [place] area.  You will need to call to schedule an appointment for further evaluation.  Please return to the emergency department if you experience any chest pain, shortness of breath, seizures or any other life-threatening illness.

## 2024-01-26 NOTE — ED Provider Notes (Addendum)
 " [place] EMERGENCY DEPARTMENT AT [facility] Provider Note   CSN: [id number] Arrival date & time: 01/26/24  1506     Patient presents with: Dental Pain   Robert Mccoy is a 54 y.o. male with no pertinent past medical history, who presents emergency department for evaluation of dental pain.  Patient reports that he has been experiencing left lower pain in his mouth for the last 3 days.  This morning, he also reported he was experiencing worsening swelling.  He states that he has been eating a relatively soft diet, but is unable to eat more than that due to the pain.  Patient states he thinks he has cracked a tooth.  Patient reports he has been taking Tylenol  and ibuprofen without relief.  He does not currently have any dental care.    Dental Pain      Prior to Admission medications  Medication Sig Start Date End Date Taking? Authorizing Provider  amoxicillin -clavulanate (AUGMENTIN ) 875-125 MG tablet Take 1 tablet by mouth every 12 (twelve) hours. 01/26/24  Yes Adelayde Minney, Marry RAMAN, PA-C  mupirocin  ointment (BACTROBAN ) 2 % Apply topically 2 (two) times daily. 07/28/22   Gomes, Adriana, DO  pantoprazole  (PROTONIX ) 40 MG tablet Take 1 tablet (40 mg total) by mouth 2 (two) times daily before a meal. 07/28/22   Janna Ferrier, DO  sucralfate  (CARAFATE ) 1 g tablet Take 1 tablet (1 g total) by mouth 2 (two) times daily. 07/28/22   Gomes, Adriana, DO    Allergies: Patient has no known allergies.    Review of Systems  HENT:  Positive for dental problem.     Updated Vital Signs BP (!) 153/85 (BP Location: Right Arm)   Pulse (!) 58   Temp 98 F (36.7 C)   Resp 16   Ht 6' (1.829 m)   Wt 93 kg   SpO2 100%   BMI 27.80 kg/m   Physical Exam Vitals and nursing note reviewed.  Constitutional:      Appearance: Normal appearance. He is not ill-appearing.  HENT:     Mouth/Throat:     Mouth: Mucous membranes are moist.     Comments: Patient with multiple dental caries noted  throughout his mouth.  He does have an obvious crack in his left lower molar.  There is also some induration noted on physical exam.  No obvious abscess or drainage. Eyes:     General: No scleral icterus. Pulmonary:     Effort: Pulmonary effort is normal. No respiratory distress.  Musculoskeletal:        General: No deformity.  Skin:    Coloration: Skin is not jaundiced.  Neurological:     General: No focal deficit present.     Mental Status: He is alert.  Psychiatric:        Mood and Affect: Mood normal.     (all labs ordered are listed, but only abnormal results are displayed) Labs Reviewed  BASIC METABOLIC PANEL WITH GFR - Abnormal; Notable for the following components:      Result Value   Glucose, Bld 114 (*)    All other components within normal limits  CBC WITH DIFFERENTIAL/PLATELET    EKG: None  Radiology: No results found.  Procedures   Medications Ordered in the ED - No data to display    Medical Decision Making Amount and/or Complexity of Data Reviewed Labs: ordered.  Risk Prescription drug management.   54 year old male who presents emergency department for evaluation of dental pain.  Differential diagnosis: Fractured tooth, dental caries, abscess, Ludwig's angina.  I did obtain basic lab work to rule out systemic infection.  Patient does not have any evidence of leukocytosis.  I personally viewed and interpreted these labs.  Patient has no airway involvement so less likely Ludwig's angina.  I did prescribe the patient with prophylactic Augmentin  for possible dental infection.  I did explain to the patient that he will need to follow-up with a dentist.  I did provide him with the dental resource guide upon discharge.  Patient verbalizes understanding to this.  I also recommended that in addition to taking the Augmentin  as prescribed that he should treat his pain with Tylenol  and ibuprofen.  Patient is agreeable.  Patient's vital signs are stable.  Patient is  appropriate for discharge at this time.   Final diagnoses:  Pain, dental    ED Discharge Orders          Ordered    amoxicillin -clavulanate (AUGMENTIN ) 875-125 MG tablet  Every 12 hours        01/26/24 1810               [name], PA-C 01/26/24 1816    Torrence, Marry RAMAN, PA-C 01/26/24 1859    Lenor Hollering, MD 01/26/24 2300  "

## 2024-01-26 NOTE — ED Triage Notes (Signed)
 Pt to ED c/o dental pain x 3 days, left bottom. Pt has not seen a dentist. Reports minimal relief with tylenol .

## 2024-01-26 NOTE — ED Provider Triage Note (Signed)
 Emergency Medicine Provider Triage Evaluation Note  Robert Mccoy , a 54 y.o. male  was evaluated in triage.  Pt complains of left-sided dental pain.  Patient reports that he feels swelling and pain to the left side of his mouth and thinks he may have a cracked tooth.  He states this has been there for the last 3 days.  Patient denies having access to a dentist.  He states he has been taking Tylenol , but it has not been helping.  Review of Systems  Positive: Dental pain Negative: Fever, chills, body aches  Physical Exam  BP (!) 151/59 (BP Location: Right Arm)   Pulse 66   Temp 98.5 F (36.9 C) (Oral)   Resp 18   Ht 6' (1.829 m)   Wt 93 kg   SpO2 99%   BMI 27.80 kg/m  Gen:   Awake, no distress   Resp:  Normal effort  MSK:   Moves extremities without difficulty  Other:  Patient does have some erythema and swelling of the left side of his gums.  No trismus noted on exam.  Patient's airway not impacted.  There does appear to be a break in one of the patient's left sided molars.  Medical Decision Making  Medically screening exam initiated at 3:33 PM.  Appropriate orders placed.  Robert Mccoy was informed that the remainder of the evaluation will be completed by another provider, this initial triage assessment does not replace that evaluation, and the importance of remaining in the ED until their evaluation is complete.  Labs ordered.   Robert Marry RAMAN, PA-C 01/26/24 1536
